# Patient Record
Sex: Male | Born: 1960 | Race: White | Hispanic: No | Marital: Single | Smoking: Former smoker
Health system: Southern US, Community
[De-identification: ages and names within clinical notes are randomized; demographics above are authoritative.]

## PROBLEM LIST (undated history)

## (undated) DIAGNOSIS — H409 Unspecified glaucoma: Secondary | ICD-10-CM

## (undated) DIAGNOSIS — N200 Calculus of kidney: Secondary | ICD-10-CM

## (undated) HISTORY — PX: TENDON REPAIR: SHX5111

## (undated) HISTORY — PX: TONSILLECTOMY: SUR1361

## (undated) HISTORY — PX: TONSILLECTOMY: SHX5217

## (undated) HISTORY — DX: Calculus of kidney: N20.0

## (undated) HISTORY — PX: NO PAST SURGERIES: SHX2092

## (undated) HISTORY — PX: HAND TENDON SURGERY: SHX663

---

## 2012-09-09 ENCOUNTER — Emergency Department: Payer: Self-pay | Admitting: Emergency Medicine

## 2012-10-12 ENCOUNTER — Emergency Department: Payer: Self-pay | Admitting: Emergency Medicine

## 2014-07-01 ENCOUNTER — Ambulatory Visit: Payer: Self-pay | Admitting: Physician Assistant

## 2014-09-18 ENCOUNTER — Ambulatory Visit
Admission: EM | Admit: 2014-09-18 | Discharge: 2014-09-18 | Disposition: A | Discharge status: Home / Self Care | Payer: Self-pay | Attending: Internal Medicine | Admitting: Internal Medicine

## 2014-09-18 ENCOUNTER — Encounter: Payer: Self-pay | Admitting: Emergency Medicine

## 2014-09-18 DIAGNOSIS — M779 Enthesopathy, unspecified: Secondary | ICD-10-CM | POA: Insufficient documentation

## 2014-09-18 DIAGNOSIS — R310 Gross hematuria: Secondary | ICD-10-CM | POA: Insufficient documentation

## 2014-09-18 DIAGNOSIS — M659 Synovitis and tenosynovitis, unspecified: Secondary | ICD-10-CM

## 2014-09-18 DIAGNOSIS — J069 Acute upper respiratory infection, unspecified: Secondary | ICD-10-CM | POA: Insufficient documentation

## 2014-09-18 DIAGNOSIS — Z87891 Personal history of nicotine dependence: Secondary | ICD-10-CM | POA: Insufficient documentation

## 2014-09-18 DIAGNOSIS — IMO0002 Reserved for concepts with insufficient information to code with codable children: Secondary | ICD-10-CM

## 2014-09-18 DIAGNOSIS — Z79899 Other long term (current) drug therapy: Secondary | ICD-10-CM | POA: Insufficient documentation

## 2014-09-18 HISTORY — DX: Unspecified glaucoma: H40.9

## 2014-09-18 LAB — URINALYSIS COMPLETE WITH MICROSCOPIC (ARMC ONLY)
Bacteria, UA: NONE SEEN — AB
Bilirubin Urine: NEGATIVE
GLUCOSE, UA: NEGATIVE mg/dL
KETONES UR: NEGATIVE mg/dL
LEUKOCYTES UA: NEGATIVE
Nitrite: NEGATIVE
PROTEIN: 100 mg/dL — AB
SPECIFIC GRAVITY, URINE: 1.015 (ref 1.005–1.030)
Squamous Epithelial / LPF: NONE SEEN — AB
pH: 5.5 (ref 5.0–8.0)

## 2014-09-18 MED ORDER — SULFAMETHOXAZOLE-TRIMETHOPRIM 800-160 MG PO TABS: 1.0000 | ORAL_TABLET | Freq: Two times a day (BID) | ORAL | Status: AC

## 2014-09-18 NOTE — ED Provider Notes (Signed)
CSN: [id number]     Arrival date & time 09/18/14  1525 History   First MD Initiated Contact with Patient 09/18/14 1647     Chief Complaint  Patient presents with  . Urine Output   HPI   Patient presents with hematuria, observed this morning. He has been taking Aleve 2 every morning for about a week, for right elbow tendinitis. He has no prior history of hematuria. He has no abdominal or pelvic pain. He denies dysuria, no urinary frequency. No change in stream. No change in bowel movements. No nausea or vomiting. Appetite is okay. No fever. For the last 7-10 days, he's had a little bit of productive cough, mildly runny/congested nose. No sore throat.  Past Medical History  Diagnosis Date  . Glaucoma    Past Surgical History  Procedure Laterality Date  . Hand tendon surgery Left     thumb  . Tonsillectomy     History reviewed. No pertinent family history. History  Substance Use Topics  . Smoking status: Former Games developermoker  . Smokeless tobacco: Not on file  . Alcohol Use: No    Review of Systems  All other systems reviewed and are negative.   Allergies  Review of patient's allergies indicates no known allergies.  Home Medications   Prior to Admission medications   Medication Sig Start Date End Date Taking? Authorizing Provider  latanoprost (XALATAN) 0.005 % ophthalmic solution 1 drop at bedtime.   Yes Historical Provider, MD  sulfamethoxazole-trimethoprim (BACTRIM DS,SEPTRA DS) 800-160 MG per tablet Take 1 tablet by mouth 2 (two) times daily. 09/18/14 09/28/14  Eustace MooreLaura W Monic Engelmann, MD   BP 130/80 mmHg  Pulse 66  Temp(Src) 97.6 F (36.4 C) (Oral)  Resp 16  Ht 5' 10.5" (1.791 m)  Wt 190 lb (86.183 kg)  BMI 26.87 kg/m2  SpO2 97% Physical Exam  Constitutional: He is oriented to person, place, and time. No distress.  Alert, nicely groomed  HENT:  Head: Atraumatic.  Bilateral TMs mildly dull, no erythema Mild to moderate nasal congestion bilaterally Throat benign  Eyes:   Conjugate gaze, no eye redness/drainage  Neck: Neck supple.  Cardiovascular: Normal rate and regular rhythm.   Pulmonary/Chest: No respiratory distress. He has no wheezes. He has no rales.  Lungs clear, symmetric breath sounds  Abdominal: Soft. He exhibits no distension. There is no tenderness. There is no rebound and no guarding.  Musculoskeletal: Normal range of motion.  Neurological: He is alert and oriented to person, place, and time.  Skin: Skin is warm and dry.  No cyanosis  Nursing note and vitals reviewed.   ED Course  Procedures (including critical care time) Labs Review Labs Reviewed  URINALYSIS COMPLETEWITH MICROSCOPIC ([facility] ONLY) - Abnormal; Notable for the following:    Color, Urine RED (*)    APPearance TURBID (*)    Hgb urine dipstick 3+ (*)    Protein, ur 100 (*)    Bacteria, UA NONE SEEN (*)    Squamous Epithelial / LPF NONE SEEN (*)    All other components within normal limits  URINE CULTURE    Imaging Review No results found.   MDM   1. Hematuria, gross   2. Tendinitis of elbow or forearm   3. Upper respiratory infection    Urine culture pending; prescription for Bactrim, for presumptive UTI/prostatitis. Patient to follow-up with either [facility], or [facility](Dr. plonk) in a week or so, to discuss further evaluation of hematuria. Continue Aleve 2 every morning over-the-counter  for the next week or so, and try to identify and limit lifting activities with the right arm, to let the right elbow flexors heal.    Eustace Moore, MD 09/18/14 1729

## 2014-09-18 NOTE — Discharge Instructions (Signed)
Prescription for sulfamethoxazole/trimethoprim (antibiotic) sent to the pharmacy.  Followup with Dr Hollace HaywardPlonk [facility] discuss further evaluation of blood in urine.    Hematuria Hematuria is blood in your urine. It can be caused by a bladder infection, kidney infection, prostate infection, kidney stone, or cancer of your urinary tract. Infections can usually be treated with medicine, and a kidney stone usually will pass through your urine. If neither of these is the cause of your hematuria, further workup to find out the reason may be needed. It is very important that you tell your health care provider about any blood you see in your urine, even if the blood stops without treatment or happens without causing pain. Blood in your urine that happens and then stops and then happens again can be a symptom of a very serious condition. Also, pain is not a symptom in the initial stages of many urinary cancers. HOME CARE INSTRUCTIONS   Drink lots of fluid, 3-4 quarts a day. If you have been diagnosed with an infection, cranberry juice is especially recommended, in addition to large amounts of water.  Avoid caffeine, tea, and carbonated beverages because they tend to irritate the bladder.  Avoid alcohol because it may irritate the prostate.  Take all medicines as directed by your health care provider.  If you were prescribed an antibiotic medicine, finish it all even if you start to feel better.  If you have been diagnosed with a kidney stone, follow your health care provider's instructions regarding straining your urine to catch the stone.  Empty your bladder often. Avoid holding urine for long periods of time.  After a bowel movement, women should cleanse front to back. Use each tissue only once.  Empty your bladder before and after sexual intercourse if you are a male. SEEK MEDICAL CARE IF:  You develop back pain.  You have a fever.  You have a feeling of sickness in your  stomach (nausea) or vomiting.  Your symptoms are not better in 3 days. Return sooner if you are getting worse. SEEK IMMEDIATE MEDICAL CARE IF:   You develop severe vomiting and are unable to keep the medicine down.  You develop severe back or abdominal pain despite taking your medicines.  You begin passing a large amount of blood or clots in your urine.  You feel extremely weak or faint, or you pass out. MAKE SURE YOU:   Understand these instructions.  Will watch your condition.  Will get help right away if you are not doing well or get worse. Document Released: 04/04/2005 Document Revised: 08/19/2013 Document Reviewed: 12/03/2012 [facility] Patient Information 2015 [place] [place]. This information is not intended to replace advice given to you by your health care provider. Make sure you discuss any questions you have with your health care provider.

## 2014-09-18 NOTE — ED Notes (Signed)
Pt reports dark urine, started about 1030 this morning. Denies abdominal pain.

## 2014-09-18 NOTE — ED Notes (Signed)
Pt also reports having a cough for a few days, and for a few weeks R arm pain especially when he strains.

## 2014-09-20 LAB — URINE CULTURE: CULTURE: NO GROWTH

## 2014-09-25 ENCOUNTER — Encounter: Payer: Self-pay | Admitting: Family Medicine

## 2014-09-25 ENCOUNTER — Ambulatory Visit (INDEPENDENT_AMBULATORY_CARE_PROVIDER_SITE_OTHER): Payer: 59 | Admitting: Family Medicine

## 2014-09-25 VITALS — BP 110/70 | HR 81 | Ht 70.5 in | Wt 194.4 lb

## 2014-09-25 DIAGNOSIS — Z801 Family history of malignant neoplasm of trachea, bronchus and lung: Secondary | ICD-10-CM | POA: Insufficient documentation

## 2014-09-25 DIAGNOSIS — Z800 Family history of malignant neoplasm of digestive organs: Secondary | ICD-10-CM

## 2014-09-25 DIAGNOSIS — Z8249 Family history of ischemic heart disease and other diseases of the circulatory system: Secondary | ICD-10-CM

## 2014-09-25 DIAGNOSIS — R319 Hematuria, unspecified: Secondary | ICD-10-CM | POA: Diagnosis not present

## 2014-09-25 DIAGNOSIS — Z23 Encounter for immunization: Secondary | ICD-10-CM | POA: Diagnosis not present

## 2014-09-25 DIAGNOSIS — Z720 Tobacco use: Secondary | ICD-10-CM | POA: Diagnosis not present

## 2014-09-25 DIAGNOSIS — H409 Unspecified glaucoma: Secondary | ICD-10-CM

## 2014-09-25 LAB — POCT URINALYSIS DIPSTICK
Bilirubin, UA: NEGATIVE
Glucose, UA: NEGATIVE
KETONES UA: NEGATIVE
LEUKOCYTES UA: NEGATIVE
NITRITE UA: NEGATIVE
Protein, UA: NEGATIVE
SPEC GRAV UA: 1.015
UROBILINOGEN UA: 0.2
pH, UA: 6

## 2014-09-25 NOTE — Progress Notes (Signed)
Date:  09/25/2014   Name:  Steven Vega   DOB:  1960-05-16   MRN:  [id number]  PCP:  Adline Potter, MD    Chief Complaint: Establish Care and Hematuria   History of Present Illness:  This is a 54 y.o. male seen at [facility] last week for gross hematuria, placed on Bactrim and urine cx sent (negative). Gross hematuria resolved, no pain, no recent trauma, no prior hx hematuria. Does have glaucoma for which he sees optho and is on drops. Last tetanus > 10 yrs ago.  Review of Systems:  Review of Systems  Constitutional: Negative for fever, appetite change and fatigue.  Respiratory: Negative for shortness of breath.   Cardiovascular: Negative for chest pain and leg swelling.  Gastrointestinal: Negative for abdominal pain.  Genitourinary: Negative for dysuria, flank pain, difficulty urinating, penile pain and testicular pain.  Skin: Negative for rash.  Hematological: Negative for adenopathy.    Patient Active Problem List   Diagnosis Date Noted  . Hematuria, undiagnosed cause 09/25/2014  . Glaucoma 09/25/2014  . FH: heart disease 09/25/2014  . FH: colon cancer 09/25/2014  . FH: lung cancer 09/25/2014    Prior to Admission medications   Medication Sig Start Date End Date Taking? Authorizing Provider  latanoprost (XALATAN) 0.005 % ophthalmic solution 1 drop at bedtime.   Yes Historical Provider, MD  sulfamethoxazole-trimethoprim (BACTRIM DS,SEPTRA DS) 800-160 MG per tablet Take 1 tablet by mouth 2 (two) times daily. 09/18/14 09/28/14 Yes Sherlene Shams, MD    No Known Allergies  Past Surgical History  Procedure Laterality Date  . Hand tendon surgery Left     thumb  . Tonsillectomy    . Tonsillectomy Bilateral     3rd grade  . Tendon repair Left     54yr old    History  Substance Use Topics  . Smoking status: Former Smoker -- 1.00 packs/day for 20 years    Types: Cigarettes    Quit date: 09/24/2009  . Smokeless tobacco: Current User    Types: Chew  . Alcohol Use: Not  on file    Family History  Problem Relation Age of Onset  . Cancer Mother   . Cancer Father     Medication list has been reviewed and updated.  Physical Examination: BP 110/70 mmHg  Pulse 81  Ht 5' 10.5" (1.791 m)  Wt 194 lb 6.4 oz (88.179 kg)  BMI 27.49 kg/m2  Physical Exam  Constitutional: He is oriented to person, place, and time. He appears well-developed and well-nourished. No distress.  HENT:  Head: Normocephalic and atraumatic.  Mouth/Throat: Oropharynx is clear and moist.  Eyes: EOM are normal. Pupils are equal, round, and reactive to light. No scleral icterus.  Neck: Neck supple. No thyromegaly present.  Cardiovascular: Normal rate, regular rhythm and normal heart sounds.   Pulmonary/Chest: Effort normal and breath sounds normal.  Abdominal: Soft. He exhibits no distension and no mass. There is no tenderness.  Genitourinary: Penis normal. No penile tenderness.  Musculoskeletal: He exhibits no edema.  Neurological: He is alert and oriented to person, place, and time. Coordination normal.  Skin: Skin is warm and dry. He is not diaphoretic.  Psychiatric: He has a normal mood and affect. His behavior is normal.    Assessment and Plan:  1. Hematuria, undiagnosed cause Urine cx negative, stop antibiotic, refer urology - Comp Met (CMET) - CBC - Ambulatory referral to Urology - POCT urinalysis dipstick  2. Glaucoma Continue eye drops per optho  3. Tobacco chew use Recommend d/c  4. FH: heart disease - Lipid Profile  5. FH: colon cancer Needs colonoscopy  6. FH: lung cancer   Return in about 1 month (around 10/25/2014).  Satira Anis. [facility]  09/25/2014

## 2014-09-25 NOTE — Patient Instructions (Signed)
Stop antibiotic, avoid aspirin and Aleve.

## 2014-09-26 LAB — COMPREHENSIVE METABOLIC PANEL
ALT: 29 IU/L (ref 0–44)
AST: 23 IU/L (ref 0–40)
Albumin/Globulin Ratio: 1.9 (ref 1.1–2.5)
Albumin: 4.5 g/dL (ref 3.5–5.5)
Alkaline Phosphatase: 99 IU/L (ref 39–117)
BILIRUBIN TOTAL: 0.3 mg/dL (ref 0.0–1.2)
BUN/Creatinine Ratio: 12 (ref 9–20)
BUN: 13 mg/dL (ref 6–24)
CHLORIDE: 102 mmol/L (ref 97–108)
CO2: 20 mmol/L (ref 18–29)
Calcium: 9.3 mg/dL (ref 8.7–10.2)
Creatinine, Ser: 1.09 mg/dL (ref 0.76–1.27)
GFR calc non Af Amer: 77 mL/min/{1.73_m2} (ref >59)
GFR, EST AFRICAN AMERICAN: 88 mL/min/{1.73_m2} (ref >59)
Globulin, Total: 2.4 g/dL (ref 1.5–4.5)
Glucose: 77 mg/dL (ref 65–99)
Potassium: 4.8 mmol/L (ref 3.5–5.2)
Sodium: 140 mmol/L (ref 134–144)
Total Protein: 6.9 g/dL (ref 6.0–8.5)

## 2014-09-26 LAB — CBC
Hematocrit: 45.9 % (ref 37.5–51.0)
Hemoglobin: 15.7 g/dL (ref 12.6–17.7)
MCH: 31 pg (ref 26.6–33.0)
MCHC: 34.2 g/dL (ref 31.5–35.7)
MCV: 91 fL (ref 79–97)
Platelets: 273 10*3/uL (ref 150–379)
RBC: 5.07 x10E6/uL (ref 4.14–5.80)
RDW: 13.1 % (ref 12.3–15.4)
WBC: 9.4 10*3/uL (ref 3.4–10.8)

## 2014-09-26 LAB — LIPID PANEL
CHOL/HDL RATIO: 4.3 ratio (ref 0.0–5.0)
CHOLESTEROL TOTAL: 178 mg/dL (ref 100–199)
HDL: 41 mg/dL (ref >39)
LDL Calculated: 101 mg/dL — ABNORMAL HIGH (ref 0–99)
TRIGLYCERIDES: 181 mg/dL — AB (ref 0–149)
VLDL Cholesterol Cal: 36 mg/dL (ref 5–40)

## 2014-10-03 ENCOUNTER — Ambulatory Visit
Admission: EM | Admit: 2014-10-03 | Discharge: 2014-10-03 | Disposition: A | Discharge status: Other Acute Inpatient Hospital | Payer: Worker's Compensation | Attending: Family Medicine | Admitting: Family Medicine

## 2014-10-03 ENCOUNTER — Encounter: Payer: Self-pay | Admitting: Emergency Medicine

## 2014-10-03 DIAGNOSIS — S68129A Partial traumatic metacarpophalangeal amputation of unspecified finger, initial encounter: Secondary | ICD-10-CM

## 2014-10-03 DIAGNOSIS — S68119A Complete traumatic metacarpophalangeal amputation of unspecified finger, initial encounter: Secondary | ICD-10-CM

## 2014-10-03 MED ORDER — HYDROMORPHONE HCL 1 MG/ML IJ SOLN
1.0000 mg | Freq: Once | INTRAMUSCULAR | Status: AC
  Administered 2014-10-03: 1 mg via INTRAMUSCULAR

## 2014-10-03 NOTE — ED Provider Notes (Signed)
CSN: [id number]     Arrival date & time 10/03/14  1237 History   First MD Initiated Contact with Patient 10/03/14 1301     Chief Complaint  Patient presents with  . Laceration   (Consider location/radiation/quality/duration/timing/severity/associated sxs/prior Treatment) HPI Comments: 54 yo male presents with a right 3rd fingertip injury from a slide valve on his pump. Patient states it cut off the tip of his finger.    Past Medical History  Diagnosis Date  . Glaucoma    Past Surgical History  Procedure Laterality Date  . Hand tendon surgery Left     thumb  . Tonsillectomy    . Tonsillectomy Bilateral     3rd grade  . Tendon repair Left     54yrs old   Family History  Problem Relation Age of Onset  . Cancer Mother   . Cancer Father    History  Substance Use Topics  . Smoking status: Former Smoker -- 1.00 packs/day for 20 years    Types: Cigarettes    Quit date: 09/24/2009  . Smokeless tobacco: Current User    Types: Chew  . Alcohol Use: Not on file    Review of Systems  Allergies  Review of patient's allergies indicates no known allergies.  Home Medications   Prior to Admission medications   Medication Sig Start Date End Date Taking? Authorizing Provider  latanoprost (XALATAN) 0.005 % ophthalmic solution 1 drop at bedtime.    Historical Provider, MD   BP 124/80 mmHg  Pulse 85  Temp(Src) 98 F (36.7 C) (Oral)  Resp 17  SpO2 97% Physical Exam  Constitutional: He appears well-developed and well-nourished. No distress.  Musculoskeletal:       Hands: Soft tissue tip of right 3 finger missing with exposure of distal phalanx bone  Skin: He is not diaphoretic.  Nursing note and vitals reviewed.   ED Course  Procedures (including critical care time) Labs Review Labs Reviewed - No data to display  Imaging Review No results found.   MDM   1. Traumatic amputation of fingertip, initial encounter    Plan: 1. diagnosis reviewed with patient and sister;  tetanus up to date per patient 2. Dilaudid 1mg  IM x1 given in clinic and tolerated well; wound bandaged 3. Recommend patient go to ED for further evaluation/management and likely hand orthopedic surgeon evaluation/management 4. Patient being transported by private vehicle with sister driving  Payton Mccallum, MD 10/03/14 [phone]

## 2014-10-03 NOTE — ED Notes (Signed)
Patient states that a valve on his pump and the lid on the valve snapped his right 3rd finger today.

## 2014-10-10 ENCOUNTER — Ambulatory Visit
Admission: EM | Admit: 2014-10-10 | Discharge: 2014-10-10 | Disposition: A | Discharge status: Home / Self Care | Payer: Worker's Compensation | Attending: Family Medicine | Admitting: Family Medicine

## 2014-10-10 ENCOUNTER — Encounter: Payer: Self-pay | Admitting: Emergency Medicine

## 2014-10-10 DIAGNOSIS — S68129A Partial traumatic metacarpophalangeal amputation of unspecified finger, initial encounter: Secondary | ICD-10-CM

## 2014-10-10 DIAGNOSIS — S68119D Complete traumatic metacarpophalangeal amputation of unspecified finger, subsequent encounter: Secondary | ICD-10-CM

## 2014-10-10 MED ORDER — OXYCODONE-ACETAMINOPHEN 5-325 MG PO TABS: 1.0000 | ORAL_TABLET | Freq: Three times a day (TID) | ORAL | Status: DC | PRN

## 2014-10-10 MED ORDER — KETOROLAC TROMETHAMINE 60 MG/2ML IM SOLN
60.0000 mg | Freq: Once | INTRAMUSCULAR | Status: AC
  Administered 2014-10-10: 60 mg via INTRAMUSCULAR

## 2014-10-10 NOTE — ED Notes (Signed)
Injured finger on right hand 1 week ago.at work.  Dressing has not been changed. Wound is weeping and need be checked. Appointment with hand surgeon is not until October 15, 2014

## 2014-10-10 NOTE — ED Provider Notes (Signed)
CSN: [id number]     Arrival date & time 10/10/14  1302 History   First MD Initiated Contact with Patient 10/10/14 1352     Chief Complaint  Patient presents with  . Finger Injury   (Consider location/radiation/quality/duration/timing/severity/associated sxs/prior Treatment) HPI Comments: 54 yo male with a fingertip amputation injury 1 week ago. Seen at [facility] ED, fingertip wound dressed, but has not been changed. Patient has not been seen for follow up, but states has an appointment at [facility] orthopedics next week Tuesday. Taking keflex and percocet. Denies any fevers or chills.   The history is provided by the patient.    Past Medical History  Diagnosis Date  . Glaucoma    Past Surgical History  Procedure Laterality Date  . Hand tendon surgery Left     thumb  . Tonsillectomy    . Tonsillectomy Bilateral     3rd grade  . Tendon repair Left     54yrs old  . No past surgeries     Family History  Problem Relation Age of Onset  . Cancer Mother   . Cancer Father    History  Substance Use Topics  . Smoking status: Former Smoker -- 1.00 packs/day for 20 years    Types: Cigarettes    Quit date: 09/24/2009  . Smokeless tobacco: Current User    Types: Chew  . Alcohol Use: No    Review of Systems  Allergies  Review of patient's allergies indicates no known allergies.  Home Medications   Prior to Admission medications   Medication Sig Start Date End Date Taking? Authorizing Provider  cephALEXin (KEFLEX) 500 MG capsule Take 500 mg by mouth 4 (four) times daily.   Yes Historical Provider, MD  latanoprost (XALATAN) 0.005 % ophthalmic solution 1 drop at bedtime.   Yes Historical Provider, MD  oxyCODONE-acetaminophen (PERCOCET) 5-325 MG per tablet Take 1-2 tablets by mouth every 8 (eight) hours as needed for severe pain. 10/10/14   Payton Mccallum, MD   BP 129/76 mmHg  Pulse 68  Temp(Src) 97.4 F (36.3 C) (Oral)  Resp 18  Ht 5\' 10"  (1.778 m)  Wt 194 lb (87.998 kg)  BMI 27.84  kg/m2  SpO2 96% Physical Exam  Constitutional: He appears well-developed and well-nourished.  Musculoskeletal:  Right 3rd fingertip wound healing  Nursing note and vitals reviewed.   ED Course  Procedures (including critical care time) Labs Review Labs Reviewed - No data to display  Imaging Review No results found.   MDM   1. Traumatic amputation of fingertip, subsequent encounter    Discharge Medication List as of 10/10/2014  3:54 PM      1.Patient given Toradol 60mg  IM x 1 for pain. Dressing changed. Patient tolerated well. Follow up with orthopedics as scheduled.  Plan: 2. rx as per orders; risks, benefits, potential side effects reviewed with patient (patient given printed rx for Percocet)    Payton Mccallum, MD 10/10/14 [phone]

## 2014-10-13 ENCOUNTER — Ambulatory Visit (INDEPENDENT_AMBULATORY_CARE_PROVIDER_SITE_OTHER): Payer: 59 | Admitting: Urology

## 2014-10-13 ENCOUNTER — Encounter: Payer: Self-pay | Admitting: Urology

## 2014-10-13 VITALS — BP 145/90 | HR 90 | Ht 70.0 in | Wt 188.0 lb

## 2014-10-13 DIAGNOSIS — R319 Hematuria, unspecified: Secondary | ICD-10-CM

## 2014-10-13 DIAGNOSIS — Z8042 Family history of malignant neoplasm of prostate: Secondary | ICD-10-CM

## 2014-10-13 LAB — URINALYSIS, COMPLETE
BILIRUBIN UA: NEGATIVE
Glucose, UA: NEGATIVE
Leukocytes, UA: NEGATIVE
Nitrite, UA: NEGATIVE
Specific Gravity, UA: >1.03 — ABNORMAL HIGH (ref 1.005–1.030)
Urobilinogen, Ur: 0.2 mg/dL (ref 0.2–1.0)
pH, UA: 5.5 (ref 5.0–7.5)

## 2014-10-13 LAB — MICROSCOPIC EXAMINATION

## 2014-10-13 NOTE — Progress Notes (Signed)
10/13/2014 3:20 PM   Alfonse AlpersCharles G Pixley 03-15-1961 [id number]  Referring provider: Schuyler AmorWilliam Plonk, MD [address] [place], [place] [address]  Chief Complaint  Patient presents with  . Hematuria    Gross Hematuria. pt saw blood in his urine x 3 wks ago.     HPI:  Mr. Steven DestineHuffstetler is a 54 year old white male with a history of painless gross hematuria 3 weeks ago. He states nothing precipitated the event. He just went to urinate and he urinated a very dark brown urine. His urine is starting to clear over the last several days, but it remains an amber color.   He did have 3-10 RBC's per power field on his urinalysis with us today. Patient is very nervous about this finding. His hands are actually shaking.  He is fearful he may have cancer.  His baseline urinary symptoms are and frequent nocturia, and frequent intermittency and infrequent weak stream. Does not have a history of urinary tract infections, kidney stones or GU malignancies. Father does have a history of prostate cancer diagnosed in his late 6460's.  He has not had a prostate exam he does not have a recent PSA.  He denies any fevers, chills, nausea, vomiting, flank pain, suprapubic pain or dysuria. The dark urine has abated.   PMH: Past Medical History  Diagnosis Date  . Glaucoma     Surgical History: Past Surgical History  Procedure Laterality Date  . Hand tendon surgery Left     thumb  . Tonsillectomy    . Tonsillectomy Bilateral     3rd grade  . Tendon repair Left     [age]yrs old  . No past surgeries      Home Medications:    Medication List       This list is accurate as of: 10/13/14  3:20 PM.  Always use your most recent med list.               cephALEXin 500 MG capsule  Commonly known as:  KEFLEX  Take 500 mg by mouth 4 (four) times daily.     latanoprost 0.005 % ophthalmic solution  Commonly known as:  XALATAN  1 drop at bedtime.     oxyCODONE-acetaminophen 5-325 MG per tablet    Commonly known as:  PERCOCET  Take 1-2 tablets by mouth every 8 (eight) hours as needed for severe pain.        Allergies: No Known Allergies  Family History: Family History  Problem Relation Age of Onset  . Breast cancer Mother   . Prostate cancer Father   . Lung cancer Mother     Social History:  reports that he quit smoking about 5 years ago. His smoking use included Cigarettes. He has a 20 pack-year smoking history. His smokeless tobacco use includes Chew. He reports that he does not drink alcohol or use illicit drugs.  ROS: Urological Symptom Review  Patient is experiencing the following symptoms: Get up at night to urinate Leakage of urine Blood in urine Weak stream   Review of Systems  Gastrointestinal (upper)  : Negative for upper GI symptoms  Gastrointestinal (lower) : Negative for lower GI symptoms  Constitutional : Negative for symptoms  Skin: Negative for skin symptoms  Eyes: Negative for eye symptoms  Ear/Nose/Throat : Negative for Ear/Nose/Throat symptoms  Hematologic/Lymphatic: Negative for Hematologic/Lymphatic symptoms  Cardiovascular : Negative for cardiovascular symptoms  Respiratory : Negative for respiratory symptoms  Endocrine: Negative for endocrine symptoms  Musculoskeletal: Negative  for musculoskeletal symptoms  Neurological: Negative for neurological symptoms  Psychologic: Negative for psychiatric symptoms   Physical Exam: BP 145/90 mmHg  Pulse 90  Ht  (1.778 m)  Wt 188 lb (85.276 kg)  BMI 26.98 kg/m2  Constitutional:  Alert and oriented, No acute distress. HEENT: [place] AT, moist mucus membranes.  Trachea midline, no masses. Cardiovascular: No clubbing, cyanosis, or edema. Respiratory: Normal respiratory effort, no increased work of breathing. GI: Abdomen is soft, nontender, nondistended, no abdominal masses GU: No CVA tenderness. GU: Patient with circumcised phallus.  Urethral meatus is patent.  No penile  discharge. No penile lesions or rashes. Scrotum without lesions, cysts, rashes and/or edema.  Testicles are located scrotally bilaterally. No masses are appreciated in the testicles. Left and right epididymis are normal.  Rectal: Patient with  normal sphincter tone. Perineum without scarring or rashes. No rectal masses are appreciated. Prostate is approximately 45 grams, no nodules are appreciated. Seminal vesicles are normal.  Skin: No rashes, bruises or suspicious lesions. Lymph: No cervical or inguinal adenopathy. Neurologic: Grossly intact, no focal deficits, moving all 4 extremities. Psychiatric: Normal mood and affect.  Laboratory Data: Results for orders placed or performed in visit on 10/13/14  Microscopic Examination  Result Value Ref Range   WBC, UA 0-5 0 -  5 /hpf   RBC, UA 11-30 (A) 0 -  2 /hpf   Epithelial Cells (non renal) 0-10 0 - 10 /hpf   Mucus, UA Present (A) Not Estab.   Bacteria, UA Moderate (A) None seen/Few  Urinalysis, Complete  Result Value Ref Range   Specific Gravity, UA >1.030 (H) 1.005 - 1.030   pH, UA 5.5 5.0 - 7.5   Color, UA Amber (A) Yellow   Appearance Ur Clear Clear   Leukocytes, UA Negative Negative   Protein, UA 2+ (A) Negative/Trace   Glucose, UA Negative Negative   Ketones, UA Trace (A) Negative   RBC, UA 3+ (A) Negative   Bilirubin, UA Negative Negative   Urobilinogen, Ur 0.2 0.2 - 1.0 mg/dL   Nitrite, UA Negative Negative   Microscopic Examination See below:    Lab Results  Component Value Date   WBC 9.4 09/25/2014   HCT 45.9 09/25/2014    Lab Results  Component Value Date   CREATININE 1.09 09/25/2014    No results found for: PSA  No results found for: TESTOSTERONE  No results found for: HGBA1C  Urinalysis    Component Value Date/Time   COLORURINE RED* 09/18/2014 1630   APPEARANCEUR TURBID* 09/18/2014 1630   LABSPEC 1.015 09/18/2014 1630   PHURINE 5.5 09/18/2014 1630   GLUCOSEU NEGATIVE 09/18/2014 1630   HGBUR 3+*  09/18/2014 1630   BILIRUBINUR negative 09/25/2014 1604   BILIRUBINUR NEGATIVE 09/18/2014 1630   KETONESUR NEGATIVE 09/18/2014 1630   PROTEINUR negative 09/25/2014 1604   PROTEINUR 100* 09/18/2014 1630   UROBILINOGEN 0.2 09/25/2014 1604   NITRITE negative 09/25/2014 1604   NITRITE NEGATIVE 09/18/2014 1630   LEUKOCYTESUR Negative 09/25/2014 1604    Pertinent Imaging:   Assessment & Plan:    1. Gross hematuria:   Explained to patient the causes of blood in the urine are as follows: stones, BPH, UTI's, damage to the urinary tract and/or cancer.  It is explained to the patient that they will be scheduled for a CT Urogram with contrast material and that in rare instances, an allergic reaction can be serious and even life threatening with the injection of contrast material.   The patient  denies any allergies to contrast, iodine and/or seafood and is not taking metformin.  - Urinalysis, Complete  2. Family history of prostate cancer:  Patient's father was diagnosed with prostate cancer at his late 78's. He does not have a prior PSA. We'll obtain a PSA when patient returns for his CT urogram report.   No Follow-up on file.  Michiel Cowboy, PA-C  [facility] [address], [address] [place], [place] [address] [phone]

## 2014-10-22 ENCOUNTER — Ambulatory Visit
Admission: RE | Admit: 2014-10-22 | Discharge: 2014-10-22 | Disposition: A | Discharge status: Home / Self Care | Payer: 59 | Source: Ambulatory Visit | Attending: Urology | Admitting: Urology

## 2014-10-22 DIAGNOSIS — R319 Hematuria, unspecified: Secondary | ICD-10-CM

## 2014-10-22 DIAGNOSIS — R932 Abnormal findings on diagnostic imaging of liver and biliary tract: Secondary | ICD-10-CM | POA: Insufficient documentation

## 2014-10-22 DIAGNOSIS — N200 Calculus of kidney: Secondary | ICD-10-CM | POA: Insufficient documentation

## 2014-10-22 DIAGNOSIS — R310 Gross hematuria: Secondary | ICD-10-CM | POA: Diagnosis present

## 2014-10-22 MED ORDER — IOHEXOL 350 MG/ML SOLN
125.0000 mL | Freq: Once | INTRAVENOUS | Status: AC | PRN
  Administered 2014-10-22: 150 mL via INTRAVENOUS

## 2014-11-13 ENCOUNTER — Encounter: Payer: Self-pay | Admitting: Urology

## 2014-11-13 ENCOUNTER — Ambulatory Visit
Admission: RE | Admit: 2014-11-13 | Discharge: 2014-11-13 | Disposition: A | Discharge status: Home / Self Care | Payer: 59 | Source: Ambulatory Visit | Attending: Urology | Admitting: Urology

## 2014-11-13 ENCOUNTER — Ambulatory Visit (INDEPENDENT_AMBULATORY_CARE_PROVIDER_SITE_OTHER): Payer: 59 | Admitting: Urology

## 2014-11-13 VITALS — BP 120/81 | HR 82 | Ht 70.2 in | Wt 191.6 lb

## 2014-11-13 DIAGNOSIS — N201 Calculus of ureter: Secondary | ICD-10-CM | POA: Diagnosis not present

## 2014-11-13 DIAGNOSIS — R310 Gross hematuria: Secondary | ICD-10-CM

## 2014-11-13 DIAGNOSIS — Z8042 Family history of malignant neoplasm of prostate: Secondary | ICD-10-CM | POA: Diagnosis not present

## 2014-11-13 LAB — URINALYSIS, COMPLETE
Bilirubin, UA: NEGATIVE
Glucose, UA: NEGATIVE
Ketones, UA: NEGATIVE
Leukocytes, UA: NEGATIVE
Nitrite, UA: NEGATIVE
PH UA: 5 (ref 5.0–7.5)
PROTEIN UA: NEGATIVE
Specific Gravity, UA: >1.03 — ABNORMAL HIGH (ref 1.005–1.030)
Urobilinogen, Ur: 0.2 mg/dL (ref 0.2–1.0)

## 2014-11-13 LAB — MICROSCOPIC EXAMINATION: WBC, UA: NONE SEEN /hpf (ref 0–?)

## 2014-11-13 NOTE — Progress Notes (Signed)
2:04 PM   Steven Vega 06/05/1960 [id number]  Referring provider: Schuyler Amor, MD [address] [place], [place] [address]  Chief Complaint  Patient presents with  . Results    CT    HPI: Steven Vega is a 54 year old white male who present today to discuss his CT Urogram results.  He is experiencing left side pain for the last few days.  He describes it as dull, colicky ache.  He is not experiencing any gross hematuria.  He is not having fevers, chills, nausea or vomiting.    Previous history:   Patient presented with a 3 week  history of painless gross hematuria one month ago. He states nothing precipitated the event. He just went to urinate and he urinated a very dark brown urine.   He did have 3-10 RBC's per power field on his urinalysis on 10/13/2014 and 11-30 RBC's/hpf with [place] today.   His baseline urinary symptoms are and frequent nocturia, and frequent intermittency and infrequent weak stream. Does not have a history of urinary tract infections, kidney stones or GU malignancies. Father does have a history of prostate cancer diagnosed in his late 8's.  He has not had a prostate exam he does not have a recent PSA.  I have reviewed the films with the patient.  He was found to have a 6 mm stone in the left UPJ.    PMH: Past Medical History  Diagnosis Date  . Glaucoma     Surgical History: Past Surgical History  Procedure Laterality Date  . Hand tendon surgery Left     thumb  . Tonsillectomy    . Tonsillectomy Bilateral     3rd grade  . Tendon repair Left     54yrs old  . No past surgeries      Home Medications:    Medication List       This list is accurate as of: 11/13/14  2:04 PM.  Always use your most recent med list.               cephALEXin 500 MG capsule  Commonly known as:  KEFLEX  Take 500 mg by mouth 4 (four) times daily.     latanoprost 0.005 % ophthalmic solution  Commonly known as:  XALATAN  1 drop at bedtime.     oxyCODONE-acetaminophen 5-325 MG per tablet  Commonly known as:  PERCOCET  Take 1-2 tablets by mouth every 8 (eight) hours as needed for severe pain.        Allergies: No Known Allergies  Family History: Family History  Problem Relation Age of Onset  . Breast cancer Mother   . Prostate cancer Father   . Lung cancer Mother     Social History:  reports that he quit smoking about 5 years ago. His smoking use included Cigarettes. He started smoking about 25 years ago. He has a 20 pack-year smoking history. His smokeless tobacco use includes Chew. He reports that he does not drink alcohol or use illicit drugs.  ROS: Urological Symptom Review  Patient is experiencing the following symptoms: Get up at night to urinate Leakage of urine Blood in urine Weak stream   Review of Systems  Gastrointestinal (upper)  : Negative for upper GI symptoms  Gastrointestinal (lower) : Negative for lower GI symptoms  Constitutional : Negative for symptoms  Skin: Negative for skin symptoms  Eyes: Negative for eye symptoms  Ear/Nose/Throat : Negative for Ear/Nose/Throat symptoms  Hematologic/Lymphatic:  Negative for Hematologic/Lymphatic symptoms  Cardiovascular : Negative for cardiovascular symptoms  Respiratory : Negative for respiratory symptoms  Endocrine: Negative for endocrine symptoms  Musculoskeletal: Negative for musculoskeletal symptoms  Neurological: Negative for neurological symptoms  Psychologic: Negative for psychiatric symptoms   Physical Exam: BP 120/81 mmHg  Pulse 82  Ht 5' 10.2" (1.783 m)  Wt 191 lb 9.6 oz (86.909 kg)  BMI 27.34 kg/m2  Constitutional:  Alert and oriented, No acute distress. HEENT: [place] AT, moist mucus membranes.  Trachea midline, no masses. Cardiovascular: No clubbing, cyanosis, or edema. Respiratory: Normal respiratory effort, no increased work of breathing. GI: Abdomen is soft, nontender, nondistended, no abdominal masses GU: No  CVA tenderness.  Skin: No rashes, bruises or suspicious lesions. Lymph: No cervical or inguinal adenopathy. Neurologic: Grossly intact, no focal deficits, moving all 4 extremities. Psychiatric: Normal mood and affect.  Laboratory Data: Results for orders placed or performed in visit on 11/13/14  Microscopic Examination  Result Value Ref Range   WBC, UA None seen 0 -  5 /hpf   RBC, UA 11-30 (A) 0 -  2 /hpf   Epithelial Cells (non renal) 0-10 0 - 10 /hpf   Bacteria, UA Few None seen/Few  PSA  Result Value Ref Range   Prostate Specific Ag, Serum 0.7 0.0 - 4.0 ng/mL  Urinalysis, Complete  Result Value Ref Range   Specific Gravity, UA >1.030 (H) 1.005 - 1.030   pH, UA 5.0 5.0 - 7.5   Color, UA Yellow Yellow   Appearance Ur Clear Clear   Leukocytes, UA Negative Negative   Protein, UA Negative Negative/Trace   Glucose, UA Negative Negative   Ketones, UA Negative Negative   RBC, UA 3+ (A) Negative   Bilirubin, UA Negative Negative   Urobilinogen, Ur 0.2 0.2 - 1.0 mg/dL   Nitrite, UA Negative Negative   Microscopic Examination See below:    Lab Results  Component Value Date   WBC 9.4 09/25/2014   HCT 45.9 09/25/2014    Lab Results  Component Value Date   CREATININE 1.09 09/25/2014    No results found for: PSA  No results found for: TESTOSTERONE  No results found for: HGBA1C  Urinalysis    Component Value Date/Time   COLORURINE RED* 09/18/2014 1630   APPEARANCEUR TURBID* 09/18/2014 1630   LABSPEC 1.015 09/18/2014 1630   PHURINE 5.5 09/18/2014 1630   GLUCOSEU Negative 10/13/2014 1440   HGBUR 3+* 09/18/2014 1630   BILIRUBINUR Negative 10/13/2014 1440   BILIRUBINUR negative 09/25/2014 1604   BILIRUBINUR NEGATIVE 09/18/2014 1630   KETONESUR NEGATIVE 09/18/2014 1630   PROTEINUR negative 09/25/2014 1604   PROTEINUR 100* 09/18/2014 1630   UROBILINOGEN 0.2 09/25/2014 1604   NITRITE Negative 10/13/2014 1440   NITRITE negative 09/25/2014 1604   NITRITE NEGATIVE  09/18/2014 1630   LEUKOCYTESUR Negative 10/13/2014 1440   LEUKOCYTESUR Negative 09/25/2014 1604    Pertinent Imaging: CLINICAL DATA: Initial encounter for 1 month history of hematuria.  EXAM: CT ABDOMEN AND PELVIS WITHOUT AND WITH CONTRAST  TECHNIQUE: Multidetector CT imaging of the abdomen and pelvis was performed following the standard protocol before and following the bolus administration of intravenous contrast.  CONTRAST: OMNIPAQUE IOHEXOL 350 MG/ML SOLN  COMPARISON: None.  FINDINGS: Lower chest: Unremarkable.  Hepatobiliary: 3 mm hypodensity in the dome of the liver is too small to characterize but likely represents a tiny cyst. Liver parenchyma otherwise unremarkable. Tiny 1-2 mm stone is seen in the neck of the gallbladder (image 22 series  7). No intrahepatic or extrahepatic biliary dilation.  Pancreas: No focal mass lesion. No dilatation of the main duct. No intraparenchymal cyst. No peripancreatic edema.  Spleen: No splenomegaly. No focal mass lesion.  Adrenals/Urinary Tract: No adrenal nodule or mass. No evidence for an enhancing lesion in either kidney. No hydronephrosis.  No stones in the right kidney or ureter. 6 x 3 x 4 mm stone is seen at the left UPJ without hydronephrosis. 2 x 2 x 4 mm stone is seen in the upper pole of the left kidney and a 1 mm stone is seen in the interpolar region of the left kidney. No left ureteral stones. No bladder stones.  Delayed imaging shows no abnormality of either intrarenal collecting system. No wall thickening in either renal pelvis. Both ureters are well opacified without focal hydroureter, filling defect, or wall thickening. No focal bladder wall abnormality with a small segment of the posterior bladder wall incompletely evaluated due to adjacent non-opacified urine.  Stomach/Bowel: Stomach is nondistended. No gastric wall thickening. No evidence of outlet obstruction. Duodenum is normally  positioned as is the ligament of Treitz. No small bowel wall thickening. No small bowel dilatation. Terminal ileum is normal. The appendix is normal. Diverticular changes are noted in the left colon without evidence of diverticulitis.  Vascular/Lymphatic: There is abdominal aortic atherosclerosis without aneurysm. Portal vein is patent as is the superior mesenteric vein. There is a large venous collateral posterior to the stomach which courses from the portal vein to the left renal vein.  No abdominal lymphadenopathy. No evidence for pelvic sidewall lymphadenopathy.  Reproductive: Prostate gland and seminal vesicles are unremarkable.  Other: No intraperitoneal free fluid.  Musculoskeletal: Bone windows reveal no worrisome lytic or sclerotic osseous lesions.  IMPRESSION: 1. 6 x 3 x 4 mm stone identified at the left UPJ without obstruction. 2. 2 additional nonobstructing stones are seen in the left kidney. 3. No definite morphologic features of cirrhosis although there is a large collateral vein coursing from the portal vein to the left renal vein. This does raise the question of portal venous hypertension. 4. Probable 1-2 mm gallstone.   Electronically Signed  By: Kennith Center M.D.  On: 10/22/2014 16:51  CLINICAL DATA: Left ureteral stone.  EXAM: ABDOMEN - 1 VIEW  COMPARISON: CT abdomen and pelvis 10/22/2014  FINDINGS: Focal calcifications lateral to the L2-3 disc space on the left likely represent previously noted renal calculus. No new stones are present. The bowel gas pattern is normal. The axial skeleton is unremarkable.  IMPRESSION: 1. Calcifications projected near the left renal hilum suggest the previously noted left UPJ stone has not moved. 2. No new calcifications.   Electronically Signed  By: Marin Roberts M.D.  On: 11/13/2014 15:27  Assessment & Plan:    1. Left UPJ stone:   Patient was found to a left UPJ stone on CT  Urogram.  We discussed ESWL and URS/LL/stent placement.  I explained  each procedure to the patient and the potential risks.  Patient wants to pursue ESWL.  I explained to the patient that the ESWL may not rid him of the stone.  He may need another procedure after the ESWL for definitive treatment.  He may also need a stent after undergoing ESWL if the particles of the treated stone get lodged in the ureter.  He understands the risk of the ESWL and wishes to proceed with the ESWL.    2. Gross hematuria:   Patient will continued to be monitored to make  sure the hematuria does not persist after the stone has been treated.    - Urinalysis, Complete  3. Family history of prostate cancer:  Patient's father was diagnosed with prostate cancer at his late 44's. He does not have a prior PSA.  We will draw a PSA today.     No Follow-up on file.  Michiel Cowboy, PA-C  [facility] [address] [place], [place] [address] [phone]

## 2014-11-14 ENCOUNTER — Encounter: Payer: Self-pay | Admitting: Urology

## 2014-11-14 DIAGNOSIS — N201 Calculus of ureter: Secondary | ICD-10-CM | POA: Insufficient documentation

## 2014-11-14 LAB — PSA: Prostate Specific Ag, Serum: 0.7 ng/mL (ref 0.0–4.0)

## 2014-11-17 ENCOUNTER — Encounter
Admission: RE | Admit: 2014-11-17 | Discharge: 2014-11-17 | Disposition: A | Discharge status: Home / Self Care | Payer: 59 | Source: Ambulatory Visit | Attending: Urology | Admitting: Urology

## 2014-11-17 ENCOUNTER — Telehealth: Payer: Self-pay | Admitting: *Deleted

## 2014-11-17 DIAGNOSIS — Z79899 Other long term (current) drug therapy: Secondary | ICD-10-CM | POA: Diagnosis not present

## 2014-11-17 DIAGNOSIS — N200 Calculus of kidney: Secondary | ICD-10-CM | POA: Diagnosis present

## 2014-11-17 DIAGNOSIS — I10 Essential (primary) hypertension: Secondary | ICD-10-CM | POA: Diagnosis not present

## 2014-11-17 DIAGNOSIS — Z87891 Personal history of nicotine dependence: Secondary | ICD-10-CM | POA: Diagnosis not present

## 2014-11-17 DIAGNOSIS — N202 Calculus of kidney with calculus of ureter: Secondary | ICD-10-CM | POA: Diagnosis not present

## 2014-11-17 DIAGNOSIS — Z9889 Other specified postprocedural states: Secondary | ICD-10-CM | POA: Diagnosis not present

## 2014-11-17 HISTORY — PX: EXTRACORPOREAL SHOCK WAVE LITHOTRIPSY: SHX1557

## 2014-11-17 NOTE — Telephone Encounter (Signed)
Attempted to contact pt. w/ results.  No answer and no VM set-up. 1st attempt. . . sm

## 2014-11-17 NOTE — Telephone Encounter (Signed)
-----   Message from Harle Battiest, PA-C sent at 11/15/2014  5:47 PM EDT ----- PSA is good.  He should have a DRE and a PSA on a yearly basis.

## 2014-11-18 NOTE — Telephone Encounter (Signed)
I spoke w/the patient and relayed their lab results as well as recommendation to have annual exams.  The pt indicated understanding and had no questions. . . . sm

## 2014-11-20 ENCOUNTER — Ambulatory Visit
Admission: RE | Admit: 2014-11-20 | Discharge: 2014-11-20 | Disposition: A | Discharge status: Home / Self Care | Payer: 59 | Source: Ambulatory Visit | Attending: Urology | Admitting: Urology

## 2014-11-20 ENCOUNTER — Encounter
Admission: RE | Disposition: A | Discharge status: Home / Self Care | Payer: Self-pay | Source: Ambulatory Visit | Attending: Urology

## 2014-11-20 ENCOUNTER — Ambulatory Visit: Payer: 59

## 2014-11-20 ENCOUNTER — Encounter: Payer: Self-pay | Admitting: *Deleted

## 2014-11-20 DIAGNOSIS — Z87891 Personal history of nicotine dependence: Secondary | ICD-10-CM | POA: Insufficient documentation

## 2014-11-20 DIAGNOSIS — Z79899 Other long term (current) drug therapy: Secondary | ICD-10-CM | POA: Insufficient documentation

## 2014-11-20 DIAGNOSIS — I10 Essential (primary) hypertension: Secondary | ICD-10-CM | POA: Insufficient documentation

## 2014-11-20 DIAGNOSIS — N200 Calculus of kidney: Secondary | ICD-10-CM

## 2014-11-20 DIAGNOSIS — N202 Calculus of kidney with calculus of ureter: Secondary | ICD-10-CM | POA: Insufficient documentation

## 2014-11-20 DIAGNOSIS — Z9889 Other specified postprocedural states: Secondary | ICD-10-CM | POA: Insufficient documentation

## 2014-11-20 HISTORY — PX: EXTRACORPOREAL SHOCK WAVE LITHOTRIPSY: SHX1557

## 2014-11-20 SURGERY — LITHOTRIPSY, ESWL
Anesthesia: Choice | Laterality: Left

## 2014-11-20 MED ORDER — DIPHENHYDRAMINE HCL 25 MG PO CAPS
ORAL_CAPSULE | ORAL | Status: AC
  Administered 2014-11-20: 25 mg via ORAL
  Filled 2014-11-20: qty 1

## 2014-11-20 MED ORDER — CIPROFLOXACIN HCL 500 MG PO TABS
500.0000 mg | ORAL_TABLET | ORAL | Status: AC
  Administered 2014-11-20: 500 mg via ORAL

## 2014-11-20 MED ORDER — PROMETHAZINE HCL 25 MG/ML IJ SOLN
25.0000 mg | Freq: Once | INTRAMUSCULAR | Status: AC
  Administered 2014-11-20: 25 mg via INTRAMUSCULAR

## 2014-11-20 MED ORDER — DIPHENHYDRAMINE HCL 25 MG PO CAPS
25.0000 mg | ORAL_CAPSULE | ORAL | Status: AC
  Administered 2014-11-20: 25 mg via ORAL

## 2014-11-20 MED ORDER — TAMSULOSIN HCL 0.4 MG PO CAPS: 0.4000 mg | ORAL_CAPSULE | Freq: Every day | ORAL | Status: AC

## 2014-11-20 MED ORDER — DEXTROSE-NACL 5-0.45 % IV SOLN
INTRAVENOUS | Status: DC
  Administered 2014-11-20: 08:00:00 via INTRAVENOUS

## 2014-11-20 MED ORDER — PROMETHAZINE HCL 25 MG/ML IJ SOLN
INTRAMUSCULAR | Status: AC
  Administered 2014-11-20: 25 mg via INTRAMUSCULAR
  Filled 2014-11-20: qty 1

## 2014-11-20 MED ORDER — DIAZEPAM 5 MG PO TABS
10.0000 mg | ORAL_TABLET | ORAL | Status: AC
  Administered 2014-11-20: 10 mg via ORAL

## 2014-11-20 MED ORDER — OXYCODONE-ACETAMINOPHEN 5-325 MG PO TABS: 1.0000 | ORAL_TABLET | Freq: Three times a day (TID) | ORAL | Status: DC | PRN

## 2014-11-20 MED ORDER — DIAZEPAM 5 MG PO TABS
ORAL_TABLET | ORAL | Status: AC
  Administered 2014-11-20: 10 mg via ORAL
  Filled 2014-11-20: qty 2

## 2014-11-20 MED ORDER — CIPROFLOXACIN HCL 500 MG PO TABS
ORAL_TABLET | ORAL | Status: AC
  Administered 2014-11-20: 500 mg via ORAL
  Filled 2014-11-20: qty 1

## 2014-11-20 NOTE — Interval H&P Note (Signed)
History and Physical Interval Note:  11/20/2014 7:59 AM  Steven Vega  has presented today for surgery, with the diagnosis of LEFT UPJ STONE  The various methods of treatment have been discussed with the patient and family. After consideration of risks, benefits and other options for treatment, the patient has consented to  Procedure(s): EXTRACORPOREAL SHOCK WAVE LITHOTRIPSY (ESWL) (Left) as a surgical intervention .  The patient's history has been reviewed, patient examined, no change in status, stable for surgery.  I have reviewed the patient's chart and labs.  Questions were answered to the patient's satisfaction.     Vanna Scotland

## 2014-11-20 NOTE — H&P (View-Only) (Signed)
2:04 PM   Steven Vega 06/05/1960 [id number]  Referring provider: Schuyler Amor, MD [address] [place], [place] [address]  Chief Complaint  Patient presents with  . Results    CT    HPI: [name] is a 54 year old white male who present today to discuss his CT Urogram results.  He is experiencing left side pain for the last few days.  He describes it as dull, colicky ache.  He is not experiencing any gross hematuria.  He is not having fevers, chills, nausea or vomiting.    Previous history:   Patient presented with a 3 week  history of painless gross hematuria one month ago. He states nothing precipitated the event. He just went to urinate and he urinated a very dark brown urine.   He did have 3-10 RBC's per power field on his urinalysis on 10/13/2014 and 11-30 RBC's/hpf with [place] today.   His baseline urinary symptoms are and frequent nocturia, and frequent intermittency and infrequent weak stream. Does not have a history of urinary tract infections, kidney stones or GU malignancies. Father does have a history of prostate cancer diagnosed in his late 8's.  He has not had a prostate exam he does not have a recent PSA.  I have reviewed the films with the patient.  He was found to have a 6 mm stone in the left UPJ.    PMH: Past Medical History  Diagnosis Date  . Glaucoma     Surgical History: Past Surgical History  Procedure Laterality Date  . Hand tendon surgery Left     thumb  . Tonsillectomy    . Tonsillectomy Bilateral     3rd grade  . Tendon repair Left     54yrs old  . No past surgeries      Home Medications:    Medication List       This list is accurate as of: 11/13/14  2:04 PM.  Always use your most recent med list.               cephALEXin 500 MG capsule  Commonly known as:  KEFLEX  Take 500 mg by mouth 4 (four) times daily.     latanoprost 0.005 % ophthalmic solution  Commonly known as:  XALATAN  1 drop at bedtime.     oxyCODONE-acetaminophen 5-325 MG per tablet  Commonly known as:  PERCOCET  Take 1-2 tablets by mouth every 8 (eight) hours as needed for severe pain.        Allergies: No Known Allergies  Family History: Family History  Problem Relation Age of Onset  . Breast cancer Mother   . Prostate cancer Father   . Lung cancer Mother     Social History:  reports that he quit smoking about 5 years ago. His smoking use included Cigarettes. He started smoking about 25 years ago. He has a 20 pack-year smoking history. His smokeless tobacco use includes Chew. He reports that he does not drink alcohol or use illicit drugs.  ROS: Urological Symptom Review  Patient is experiencing the following symptoms: Get up at night to urinate Leakage of urine Blood in urine Weak stream   Review of Systems  Gastrointestinal (upper)  : Negative for upper GI symptoms  Gastrointestinal (lower) : Negative for lower GI symptoms  Constitutional : Negative for symptoms  Skin: Negative for skin symptoms  Eyes: Negative for eye symptoms  Ear/Nose/Throat : Negative for Ear/Nose/Throat symptoms  Hematologic/Lymphatic:  Negative for Hematologic/Lymphatic symptoms  Cardiovascular : Negative for cardiovascular symptoms  Respiratory : Negative for respiratory symptoms  Endocrine: Negative for endocrine symptoms  Musculoskeletal: Negative for musculoskeletal symptoms  Neurological: Negative for neurological symptoms  Psychologic: Negative for psychiatric symptoms   Physical Exam: BP 120/81 mmHg  Pulse 82  Ht 5' 10.2" (1.783 m)  Wt 191 lb 9.6 oz (86.909 kg)  BMI 27.34 kg/m2  Constitutional:  Alert and oriented, No acute distress. HEENT: [place] AT, moist mucus membranes.  Trachea midline, no masses. Cardiovascular: No clubbing, cyanosis, or edema. Respiratory: Normal respiratory effort, no increased work of breathing. GI: Abdomen is soft, nontender, nondistended, no abdominal masses GU: No  CVA tenderness.  Skin: No rashes, bruises or suspicious lesions. Lymph: No cervical or inguinal adenopathy. Neurologic: Grossly intact, no focal deficits, moving all 4 extremities. Psychiatric: Normal mood and affect.  Laboratory Data: Results for orders placed or performed in visit on 11/13/14  Microscopic Examination  Result Value Ref Range   WBC, UA None seen 0 -  5 /hpf   RBC, UA 11-30 (A) 0 -  2 /hpf   Epithelial Cells (non renal) 0-10 0 - 10 /hpf   Bacteria, UA Few None seen/Few  PSA  Result Value Ref Range   Prostate Specific Ag, Serum 0.7 0.0 - 4.0 ng/mL  Urinalysis, Complete  Result Value Ref Range   Specific Gravity, UA >1.030 (H) 1.005 - 1.030   pH, UA 5.0 5.0 - 7.5   Color, UA Yellow Yellow   Appearance Ur Clear Clear   Leukocytes, UA Negative Negative   Protein, UA Negative Negative/Trace   Glucose, UA Negative Negative   Ketones, UA Negative Negative   RBC, UA 3+ (A) Negative   Bilirubin, UA Negative Negative   Urobilinogen, Ur 0.2 0.2 - 1.0 mg/dL   Nitrite, UA Negative Negative   Microscopic Examination See below:    Lab Results  Component Value Date   WBC 9.4 09/25/2014   HCT 45.9 09/25/2014    Lab Results  Component Value Date   CREATININE 1.09 09/25/2014    No results found for: PSA  No results found for: TESTOSTERONE  No results found for: HGBA1C  Urinalysis    Component Value Date/Time   COLORURINE RED* 09/18/2014 1630   APPEARANCEUR TURBID* 09/18/2014 1630   LABSPEC 1.015 09/18/2014 1630   PHURINE 5.5 09/18/2014 1630   GLUCOSEU Negative 10/13/2014 1440   HGBUR 3+* 09/18/2014 1630   BILIRUBINUR Negative 10/13/2014 1440   BILIRUBINUR negative 09/25/2014 1604   BILIRUBINUR NEGATIVE 09/18/2014 1630   KETONESUR NEGATIVE 09/18/2014 1630   PROTEINUR negative 09/25/2014 1604   PROTEINUR 100* 09/18/2014 1630   UROBILINOGEN 0.2 09/25/2014 1604   NITRITE Negative 10/13/2014 1440   NITRITE negative 09/25/2014 1604   NITRITE NEGATIVE  09/18/2014 1630   LEUKOCYTESUR Negative 10/13/2014 1440   LEUKOCYTESUR Negative 09/25/2014 1604    Pertinent Imaging: CLINICAL DATA: Initial encounter for 1 month history of hematuria.  EXAM: CT ABDOMEN AND PELVIS WITHOUT AND WITH CONTRAST  TECHNIQUE: Multidetector CT imaging of the abdomen and pelvis was performed following the standard protocol before and following the bolus administration of intravenous contrast.  CONTRAST: OMNIPAQUE IOHEXOL 350 MG/ML SOLN  COMPARISON: None.  FINDINGS: Lower chest: Unremarkable.  Hepatobiliary: 3 mm hypodensity in the dome of the liver is too small to characterize but likely represents a tiny cyst. Liver parenchyma otherwise unremarkable. Tiny 1-2 mm stone is seen in the neck of the gallbladder (image 22 series  7). No intrahepatic or extrahepatic biliary dilation.  Pancreas: No focal mass lesion. No dilatation of the main duct. No intraparenchymal cyst. No peripancreatic edema.  Spleen: No splenomegaly. No focal mass lesion.  Adrenals/Urinary Tract: No adrenal nodule or mass. No evidence for an enhancing lesion in either kidney. No hydronephrosis.  No stones in the right kidney or ureter. 6 x 3 x 4 mm stone is seen at the left UPJ without hydronephrosis. 2 x 2 x 4 mm stone is seen in the upper pole of the left kidney and a 1 mm stone is seen in the interpolar region of the left kidney. No left ureteral stones. No bladder stones.  Delayed imaging shows no abnormality of either intrarenal collecting system. No wall thickening in either renal pelvis. Both ureters are well opacified without focal hydroureter, filling defect, or wall thickening. No focal bladder wall abnormality with a small segment of the posterior bladder wall incompletely evaluated due to adjacent non-opacified urine.  Stomach/Bowel: Stomach is nondistended. No gastric wall thickening. No evidence of outlet obstruction. Duodenum is normally  positioned as is the ligament of Treitz. No small bowel wall thickening. No small bowel dilatation. Terminal ileum is normal. The appendix is normal. Diverticular changes are noted in the left colon without evidence of diverticulitis.  Vascular/Lymphatic: There is abdominal aortic atherosclerosis without aneurysm. Portal vein is patent as is the superior mesenteric vein. There is a large venous collateral posterior to the stomach which courses from the portal vein to the left renal vein.  No abdominal lymphadenopathy. No evidence for pelvic sidewall lymphadenopathy.  Reproductive: Prostate gland and seminal vesicles are unremarkable.  Other: No intraperitoneal free fluid.  Musculoskeletal: Bone windows reveal no worrisome lytic or sclerotic osseous lesions.  IMPRESSION: 1. 6 x 3 x 4 mm stone identified at the left UPJ without obstruction. 2. 2 additional nonobstructing stones are seen in the left kidney. 3. No definite morphologic features of cirrhosis although there is a large collateral vein coursing from the portal vein to the left renal vein. This does raise the question of portal venous hypertension. 4. Probable 1-2 mm gallstone.   Electronically Signed  By: Kennith Center M.D.  On: 10/22/2014 16:51  CLINICAL DATA: Left ureteral stone.  EXAM: ABDOMEN - 1 VIEW  COMPARISON: CT abdomen and pelvis 10/22/2014  FINDINGS: Focal calcifications lateral to the L2-3 disc space on the left likely represent previously noted renal calculus. No new stones are present. The bowel gas pattern is normal. The axial skeleton is unremarkable.  IMPRESSION: 1. Calcifications projected near the left renal hilum suggest the previously noted left UPJ stone has not moved. 2. No new calcifications.   Electronically Signed  By: Marin Roberts M.D.  On: 11/13/2014 15:27  Assessment & Plan:    1. Left UPJ stone:   Patient was found to a left UPJ stone on CT  Urogram.  We discussed ESWL and URS/LL/stent placement.  I explained  each procedure to the patient and the potential risks.  Patient wants to pursue ESWL.  I explained to the patient that the ESWL may not rid him of the stone.  He may need another procedure after the ESWL for definitive treatment.  He may also need a stent after undergoing ESWL if the particles of the treated stone get lodged in the ureter.  He understands the risk of the ESWL and wishes to proceed with the ESWL.    2. Gross hematuria:   Patient will continued to be monitored to make  sure the hematuria does not persist after the stone has been treated.    - Urinalysis, Complete  3. Family history of prostate cancer:  Patient's father was diagnosed with prostate cancer at his late 44's. He does not have a prior PSA.  We will draw a PSA today.     No Follow-up on file.  Michiel Cowboy, PA-C  [facility] [address] [place], [place] [address] [phone]

## 2014-11-20 NOTE — Discharge Instructions (Signed)
See [facility] discharge instructions in chart.  Lithotripsy, Care After Refer to this sheet in the next few weeks. These instructions provide you with information on caring for yourself after your procedure. Your health care provider may also give you more specific instructions. Your treatment has been planned according to current medical practices, but problems sometimes occur. Call your health care provider if you have any problems or questions after your procedure. WHAT TO EXPECT AFTER THE PROCEDURE   Your urine may have a red tinge for a few days after treatment. Blood loss is usually minimal.  You may have soreness in the back or flank area. This usually goes away after a few days. The procedure can cause blotches or bruises on the back where the pressure wave enters the skin. These marks usually cause only minimal discomfort and should disappear in a short time.  Stone fragments should begin to pass within 24 hours of treatment. However, a delayed passage is not unusual.  You may have pain, discomfort, and feel sick to your stomach (nauseated) when the crushed fragments of stone are passed down the tube from the kidney to the bladder. Stone fragments can pass soon after the procedure and may last for up to 4-8 weeks.  A small number of patients may have severe pain when stone fragments are not able to pass, which leads to an obstruction.  If your stone is greater than 1 inch (2.5 cm) in diameter or if you have multiple stones that have a combined diameter greater than 1 inch (2.5 cm), you may require more than one treatment.  If you had a stent placed prior to your procedure, you may experience some discomfort, especially during urination. You may experience the pain or discomfort in your flank or back, or you may experience a sharp pain or discomfort at the base of your penis or in your lower abdomen. The discomfort usually lasts only a few minutes after urinating. HOME CARE  INSTRUCTIONS   Rest at home until you feel your energy improving.  Only take over-the-counter or prescription medicines for pain, discomfort, or fever as directed by your health care provider. Depending on the type of lithotripsy, you may need to take antibiotics and anti-inflammatory medicines for a few days.  Drink enough water and fluids to keep your urine clear or pale yellow. This helps "flush" your kidneys. It helps pass any remaining pieces of stone and prevents stones from coming back.  Most people can resume daily activities within 1-2 days after standard lithotripsy. It can take longer to recover from laser and percutaneous lithotripsy.  If the stones are in your urinary system, you may be asked to strain your urine at home to look for stones. Any stones that are found can be sent to a medical lab for examination.  Visit your health care provider for a follow-up appointment in a few weeks. Your doctor may remove your stent if you have one. Your health care provider will also check to see whether stone particles still remain. SEEK MEDICAL CARE IF:   Your pain is not relieved by medicine.  You have a lasting nauseous feeling.  You feel there is too much blood in the urine.  You develop persistent problems with frequent or painful urination that does not at least partially improve after 2 days following the procedure.  You have a congested cough.  You feel lightheaded.  You develop a rash or any other signs that might suggest an allergic problem.  You develop any reaction or side effects to your medicine(s). SEEK IMMEDIATE MEDICAL CARE IF:   You experience severe back or flank pain or both.  You see nothing but blood when you urinate.  You cannot pass any urine at all.  You have a fever or shaking chills.  You develop shortness of breath, difficulty breathing, or chest pain.  You develop vomiting that will not stop after 6-8 hours.  You have a fainting  episode. Document Released: 04/24/2007 Document Revised: 01/23/2013 Document Reviewed: 10/18/2012 [facility] Patient Information 2015 [place], [place]. This information is not intended to replace advice given to you by your health care provider. Make sure you discuss any questions you have with your health care provider.  AMBULATORY SURGERY  DISCHARGE INSTRUCTIONS   1) The drugs that you were given will stay in your system until tomorrow so for the next 24 hours you should not:  A) Drive an automobile B) Make any legal decisions C) Drink any alcoholic beverage   2) You may resume regular meals tomorrow.  Today it is better to start with liquids and gradually work up to solid foods.  You may eat anything you prefer, but it is better to start with liquids, then soup and crackers, and gradually work up to solid foods.   3) Please notify your doctor immediately if you have any unusual bleeding, trouble breathing, redness and pain at the surgery site, drainage, fever, or pain not relieved by medication.    4) Additional Instructions:        Please contact your physician with any problems or Same Day Surgery at [phone], Monday through Friday 6 am to 4 pm, or [place] [facility] number at [phone].

## 2014-12-04 ENCOUNTER — Ambulatory Visit (INDEPENDENT_AMBULATORY_CARE_PROVIDER_SITE_OTHER): Payer: 59 | Admitting: Urology

## 2014-12-04 ENCOUNTER — Encounter: Payer: Self-pay | Admitting: Urology

## 2014-12-04 VITALS — BP 113/77 | HR 87 | Ht 70.5 in | Wt 190.4 lb

## 2014-12-04 DIAGNOSIS — Z8042 Family history of malignant neoplasm of prostate: Secondary | ICD-10-CM

## 2014-12-04 DIAGNOSIS — N201 Calculus of ureter: Secondary | ICD-10-CM

## 2014-12-04 DIAGNOSIS — R310 Gross hematuria: Secondary | ICD-10-CM

## 2014-12-04 LAB — MICROSCOPIC EXAMINATION
Bacteria, UA: NONE SEEN
EPITHELIAL CELLS (NON RENAL): NONE SEEN /HPF (ref 0–10)
WBC, UA: NONE SEEN /hpf (ref 0–?)

## 2014-12-04 LAB — URINALYSIS, COMPLETE
Bilirubin, UA: NEGATIVE
Glucose, UA: NEGATIVE
KETONES UA: NEGATIVE
LEUKOCYTES UA: NEGATIVE
Nitrite, UA: NEGATIVE
PH UA: 5.5 (ref 5.0–7.5)
Protein, UA: NEGATIVE
SPEC GRAV UA: 1.025 (ref 1.005–1.030)
Urobilinogen, Ur: 0.2 mg/dL (ref 0.2–1.0)

## 2014-12-04 NOTE — Progress Notes (Signed)
4:04 PM   Steven Vega [birth year] [id number]  Referring provider: Schuyler Amor, MD [address] [place], [place] [address]  Chief Complaint  Patient presents with  . Follow-up    2 week after ESWL    HPI: Steven Vega is a 54 year old white male who underwent ESWL on 11/20/2014 for a left UPJ stone with Dr. Apolinar Junes.  Post procedural course was uneventful.  He passed some flecks and did not have significant flank pain or hematuria.    Previous history: Patient presented with a 3 week  history of painless gross hematuria one month ago. He states nothing precipitated the event. He just went to urinate and he urinated a very dark brown urine.   He did have 3-10 RBC's per power field on his urinalysis on 10/13/2014 and 11-30 RBC's/hpf on 11/13/2014.  He was also experiencing left sided flank pain.    His baseline urinary symptoms are and frequent nocturia, and frequent intermittency and infrequent weak stream. Does not have a history of urinary tract infections, kidney stones or GU malignancies. Father does have a history of prostate cancer diagnosed in his late 33's.  He has not had a prostate exam he does not have a recent PSA.  Today, he is feeling well with no complaints.  He denies fevers, chills, nausea or vomiting.  He is also not experiencing any gross hematuria, dysuria or flank pain.    PMH: Past Medical History  Diagnosis Date  . Glaucoma   . Kidney stones     Surgical History: Past Surgical History  Procedure Laterality Date  . Hand tendon surgery Left     thumb  . Tonsillectomy    . Tonsillectomy Bilateral     3rd grade  . Tendon repair Left     54yrs old  . No past surgeries    . Extracorporeal shock wave lithotripsy  11/2014    Home Medications:    Medication List       This list is accurate as of: 12/04/14  4:04 PM.  Always use your most recent med list.               cephALEXin 500 MG capsule  Commonly known as:  KEFLEX    Take 500 mg by mouth 4 (four) times daily.     latanoprost 0.005 % ophthalmic solution  Commonly known as:  XALATAN  1 drop at bedtime.     oxyCODONE-acetaminophen 5-325 MG per tablet  Commonly known as:  PERCOCET  Take 1-2 tablets by mouth every 8 (eight) hours as needed for severe pain.     tamsulosin 0.4 MG Caps capsule  Commonly known as:  FLOMAX  Take 1 capsule (0.4 mg total) by mouth daily.        Allergies: No Known Allergies  Family History: Family History  Problem Relation Age of Onset  . Breast cancer Mother   . Prostate cancer Father   . Lung cancer Mother   . Kidney disease Neg Hx     Social History:  reports that he quit smoking about 5 years ago. His smoking use included Cigarettes. He started smoking about 25 years ago. He has a 20 pack-year smoking history. His smokeless tobacco use includes Chew. He reports that he does not drink alcohol or use illicit drugs.  ROS: Urological Symptom Review  Patient is experiencing the following symptoms: Get up at night to urinate Leakage of urine Blood in urine Weak stream  Review of Systems  Gastrointestinal (upper)  : Negative for upper GI symptoms  Gastrointestinal (lower) : Negative for lower GI symptoms  Constitutional : Negative for symptoms  Skin: Negative for skin symptoms  Eyes: Negative for eye symptoms  Ear/Nose/Throat : Negative for Ear/Nose/Throat symptoms  Hematologic/Lymphatic: Negative for Hematologic/Lymphatic symptoms  Cardiovascular : Negative for cardiovascular symptoms  Respiratory : Negative for respiratory symptoms  Endocrine: Negative for endocrine symptoms  Musculoskeletal: Negative for musculoskeletal symptoms  Neurological: Negative for neurological symptoms  Psychologic: Negative for psychiatric symptoms   Physical Exam: Blood pressure 113/77, pulse 87, height 5' 10.5" (1.791 m), weight 190 lb 6.4 oz (86.365 kg).  Laboratory Data: Results for orders  placed or performed in visit on 12/04/14  Microscopic Examination  Result Value Ref Range   WBC, UA None seen 0 -  5 /hpf   RBC, UA 0-2 0 -  2 /hpf   Epithelial Cells (non renal) None seen 0 - 10 /hpf   Mucus, UA Present (A) Not Estab.   Bacteria, UA None seen None seen/Few  Urinalysis, Complete  Result Value Ref Range   Specific Gravity, UA 1.025 1.005 - 1.030   pH, UA 5.5 5.0 - 7.5   Color, UA Yellow Yellow   Appearance Ur Clear Clear   Leukocytes, UA Negative Negative   Protein, UA Negative Negative/Trace   Glucose, UA Negative Negative   Ketones, UA Negative Negative   RBC, UA Trace (A) Negative   Bilirubin, UA Negative Negative   Urobilinogen, Ur 0.2 0.2 - 1.0 mg/dL   Nitrite, UA Negative Negative   Microscopic Examination See below:    Lab Results  Component Value Date   WBC 9.4 09/25/2014   HCT 45.9 09/25/2014    Lab Results  Component Value Date   CREATININE 1.09 09/25/2014    Lab Results  Component Value Date   PSA 0.7 11/13/2014     Pertinent Imaging: CLINICAL DATA: Initial encounter for 1 month history of hematuria.  EXAM: CT ABDOMEN AND PELVIS WITHOUT AND WITH CONTRAST  TECHNIQUE: Multidetector CT imaging of the abdomen and pelvis was performed following the standard protocol before and following the bolus administration of intravenous contrast.  CONTRAST: OMNIPAQUE IOHEXOL 350 MG/ML SOLN  COMPARISON: None.  FINDINGS: Lower chest: Unremarkable.  Hepatobiliary: 3 mm hypodensity in the dome of the liver is too small to characterize but likely represents a tiny cyst. Liver parenchyma otherwise unremarkable. Tiny 1-2 mm stone is seen in the neck of the gallbladder (image 22 series 7). No intrahepatic or extrahepatic biliary dilation.  Pancreas: No focal mass lesion. No dilatation of the main duct. No intraparenchymal cyst. No peripancreatic edema.  Spleen: No splenomegaly. No focal mass lesion.  Adrenals/Urinary Tract:  No adrenal nodule or mass. No evidence for an enhancing lesion in either kidney. No hydronephrosis.  No stones in the right kidney or ureter. 6 x 3 x 4 mm stone is seen at the left UPJ without hydronephrosis. 2 x 2 x 4 mm stone is seen in the upper pole of the left kidney and a 1 mm stone is seen in the interpolar region of the left kidney. No left ureteral stones. No bladder stones.  Delayed imaging shows no abnormality of either intrarenal collecting system. No wall thickening in either renal pelvis. Both ureters are well opacified without focal hydroureter, filling defect, or wall thickening. No focal bladder wall abnormality with a small segment of the posterior bladder wall incompletely evaluated due to adjacent non-opacified  urine.  Stomach/Bowel: Stomach is nondistended. No gastric wall thickening. No evidence of outlet obstruction. Duodenum is normally positioned as is the ligament of Treitz. No small bowel wall thickening. No small bowel dilatation. Terminal ileum is normal. The appendix is normal. Diverticular changes are noted in the left colon without evidence of diverticulitis.  Vascular/Lymphatic: There is abdominal aortic atherosclerosis without aneurysm. Portal vein is patent as is the superior mesenteric vein. There is a large venous collateral posterior to the stomach which courses from the portal vein to the left renal vein.  No abdominal lymphadenopathy. No evidence for pelvic sidewall lymphadenopathy.  Reproductive: Prostate gland and seminal vesicles are unremarkable.  Other: No intraperitoneal free fluid.  Musculoskeletal: Bone windows reveal no worrisome lytic or sclerotic osseous lesions.  IMPRESSION: 1. 6 x 3 x 4 mm stone identified at the left UPJ without obstruction. 2. 2 additional nonobstructing stones are seen in the left kidney. 3. No definite morphologic features of cirrhosis although there is a large collateral vein coursing from  the portal vein to the left renal vein. This does raise the question of portal venous hypertension. 4. Probable 1-2 mm gallstone.   Electronically Signed  By: Kennith Center M.D.  On: 10/22/2014 16:51  CLINICAL DATA: Left ureteral stone.  EXAM: ABDOMEN - 1 VIEW  COMPARISON: CT abdomen and pelvis 10/22/2014  FINDINGS: Focal calcifications lateral to the L2-3 disc space on the left likely represent previously noted renal calculus. No new stones are present. The bowel gas pattern is normal. The axial skeleton is unremarkable.  IMPRESSION: 1. Calcifications projected near the left renal hilum suggest the previously noted left UPJ stone has not moved. 2. No new calcifications.   Electronically Signed  By: Marin Roberts M.D.  On: 11/13/2014 15:27  Assessment & Plan:    1. Left UPJ stone:   Patient underwent ESWL for a left UPJ stone. His postprocedural course was uneventful. He did not capture any large fragments for analysis. He will have a KUB and RUS and will return in 1 month to discuss the report.  2. Gross hematuria:   Patient will continued to be monitored to make sure the hematuria does not persist after the stone has been treated.  Patient has not had any further gross hematuria and he has no hematuria on today's urinalysis we will continue to monitor.  - Urinalysis, Complete  3. Family history of prostate cancer:  Patient's father was diagnosed with prostate cancer at his late 34's.  His DRE was benign and his recent PSA was 0.7 ng/mL on 11/13/2014.  We will follow annually with DRE and PSAs.  4. Findings suggestive of portal venous hypertension found on CT:   Findings of unknown significance. I will forward this note to his PCP, Dr. Hollace Hayward.   Return for RUS and KUB report in one month.  Michiel Cowboy, PA-C  [facility] [address], [address] [place], [place] [address] [phone]

## 2014-12-13 DIAGNOSIS — R310 Gross hematuria: Secondary | ICD-10-CM | POA: Insufficient documentation

## 2014-12-13 DIAGNOSIS — Z8042 Family history of malignant neoplasm of prostate: Secondary | ICD-10-CM | POA: Insufficient documentation

## 2014-12-26 ENCOUNTER — Encounter: Payer: Self-pay | Admitting: Urology

## 2015-10-30 ENCOUNTER — Encounter: Payer: Self-pay | Admitting: Gynecology

## 2015-10-30 ENCOUNTER — Ambulatory Visit
Admission: EM | Admit: 2015-10-30 | Discharge: 2015-10-30 | Disposition: A | Discharge status: Home / Self Care | Payer: 59 | Attending: Emergency Medicine | Admitting: Emergency Medicine

## 2015-10-30 DIAGNOSIS — S0101XA Laceration without foreign body of scalp, initial encounter: Secondary | ICD-10-CM | POA: Diagnosis not present

## 2015-10-30 MED ORDER — BACITRACIN-POLYMYXIN B 500-10000 UNIT/GM EX OINT: TOPICAL_OINTMENT | Freq: Two times a day (BID) | CUTANEOUS | Status: DC

## 2015-10-30 MED ORDER — CEPHALEXIN 500 MG PO CAPS: 500.0000 mg | ORAL_CAPSULE | Freq: Four times a day (QID) | ORAL | Status: DC

## 2015-10-30 NOTE — Discharge Instructions (Signed)
You may use bacitracin or Polysporin on the laceration well his heals. Follow up with us in 8-10 days to have the staples removed. Return here sooner for any signs of infection.
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
## 2015-10-30 NOTE — ED Notes (Signed)
Per patient while at [place] injury top on head when he stood up on a piece of metal. Per patient last tetanus hot was within a year.

## 2015-11-09 ENCOUNTER — Ambulatory Visit
Admission: EM | Admit: 2015-11-09 | Discharge: 2015-11-09 | Disposition: A | Discharge status: Home / Self Care | Payer: 59

## 2015-11-09 DIAGNOSIS — Z4802 Encounter for removal of sutures: Secondary | ICD-10-CM

## 2015-11-09 NOTE — ED Triage Notes (Signed)
Staples Removal

## 2015-11-09 NOTE — ED Notes (Signed)
Nurse removed 3 staples from patients head. The laceration looks, clean, dry and is healing well.

## 2016-07-04 ENCOUNTER — Encounter: Payer: Self-pay | Admitting: *Deleted

## 2016-07-04 ENCOUNTER — Ambulatory Visit
Admission: EM | Admit: 2016-07-04 | Discharge: 2016-07-04 | Disposition: A | Discharge status: Home / Self Care | Payer: 59 | Attending: Family Medicine | Admitting: Family Medicine

## 2016-07-04 DIAGNOSIS — Z0289 Encounter for other administrative examinations: Secondary | ICD-10-CM

## 2016-07-04 LAB — DEPT OF TRANSP DIPSTICK, URINE (ARMC ONLY)
Glucose, UA: NEGATIVE mg/dL
Hgb urine dipstick: NEGATIVE
Protein, ur: NEGATIVE mg/dL
Specific Gravity, Urine: 1.025 (ref 1.005–1.030)

## 2016-07-04 NOTE — ED Provider Notes (Signed)
[facility]    CSN: [id number] Arrival date & time: 07/04/16  1758     History   Chief Complaint Chief Complaint  Patient presents with  . Commercial Driver's License Exam    HPI Steven Vega is a 56 y.o. male.   Patient here for DOT Physical (see scanned form)   The history is provided by the patient.    Past Medical History:  Diagnosis Date  . Glaucoma   . Kidney stones     Patient Active Problem List   Diagnosis Date Noted  . Gross hematuria 12/13/2014  . Family history of prostate cancer 12/13/2014  . Left ureteral stone 11/14/2014  . Family history of prostate cancer in father 10/13/2014  . Hematuria, undiagnosed cause 09/25/2014  . Glaucoma 09/25/2014  . FH: heart disease 09/25/2014  . FH: colon cancer 09/25/2014  . FH: lung cancer 09/25/2014    Past Surgical History:  Procedure Laterality Date  . EXTRACORPOREAL SHOCK WAVE LITHOTRIPSY  11/2014  . EXTRACORPOREAL SHOCK WAVE LITHOTRIPSY Left 11/20/2014   Procedure: EXTRACORPOREAL SHOCK WAVE LITHOTRIPSY (ESWL);  Surgeon: Vanna Scotland, MD;  Location: [facility] ORS;  Service: Urology;  Laterality: Left;  . HAND TENDON SURGERY Left    thumb  . NO PAST SURGERIES    . TENDON REPAIR Left    56yrs old  . TONSILLECTOMY    . TONSILLECTOMY Bilateral    3rd grade       Home Medications    Prior to Admission medications   Medication Sig Start Date End Date Taking? Authorizing Provider  bacitracin-polymyxin b (POLYSPORIN) ointment Apply topically 2 (two) times daily. 10/30/15   Domenick Gong, MD  cephALEXin (KEFLEX) 500 MG capsule Take 1 capsule (500 mg total) by mouth 4 (four) times daily. X 10 days 10/30/15   Domenick Gong, MD  latanoprost (XALATAN) 0.005 % ophthalmic solution 1 drop at bedtime.    Historical Provider, MD  tamsulosin (FLOMAX) 0.4 MG CAPS capsule Take 1 capsule (0.4 mg total) by mouth daily. 11/20/14   Vanna Scotland, MD    Family History Family History  Problem  Relation Age of Onset  . Breast cancer Mother   . Lung cancer Mother   . Prostate cancer Father   . Kidney disease Neg Hx     Social History Social History  Substance Use Topics  . Smoking status: Former Smoker    Packs/day: 1.00    Years: 20.00    Types: Cigarettes    Start date: 09/24/1989    Quit date: 09/24/2009  . Smokeless tobacco: Current User    Types: Chew  . Alcohol use No     Allergies   Patient has no known allergies.   Review of Systems Review of Systems   Physical Exam Triage Vital Signs ED Triage Vitals  Enc Vitals Group     BP 07/04/16 1823 131/77     Pulse Rate 07/04/16 1823 73     Resp 07/04/16 1823 16     Temp 07/04/16 1823 98.3 F (36.8 C)     Temp Source 07/04/16 1823 Oral     SpO2 07/04/16 1823 100 %     Weight 07/04/16 1825 185 lb (83.9 kg)     Height 07/04/16 1825 5\' 11"  (1.803 m)     Head Circumference --      Peak Flow --      Pain Score --      Pain Loc --      Pain Edu? --  Excl. in GC? --    No data found.   Updated Vital Signs BP 131/77 (BP Location: Left Arm)   Pulse 73   Temp 98.3 F (36.8 C) (Oral)   Resp 16   Ht 5\' 11"  (1.803 m)   Wt 185 lb (83.9 kg)   SpO2 100%   BMI 25.80 kg/m   Visual Acuity Right Eye Distance:   Left Eye Distance:   Bilateral Distance:    Right Eye Near:   Left Eye Near:    Bilateral Near:     Physical Exam   UC Treatments / Results  Labs (all labs ordered are listed, but only abnormal results are displayed) Labs Reviewed  DEPT OF TRANSP DIPSTICK, URINE([facility] ONLY)    EKG  EKG Interpretation None       Radiology No results found.  Procedures Procedures (including critical care time)  Medications Ordered in UC Medications - No data to display   Initial Impression / Assessment and Plan / UC Course  I have reviewed the triage vital signs and the nursing notes.  Pertinent labs & imaging results that were available during my care of the patient were  reviewed by me and considered in my medical decision making (see chart for details).       Final Clinical Impressions(s) / UC Diagnoses   Final diagnoses:  Encounter for examination required by Department of Transportation (DOT)    New Prescriptions New Prescriptions   No medications on file   DOT Physical (medically qualified for 3 months; patient reports a history of "questionable" glaucoma but has stopped the drops prescribed by ophthalmologist and has not seen the ophthalmologist in over 1 year; explained to patient that he needs to see his ophthalmologist and bring documentation as to status); see scanned form)   [name] [name], MD 07/04/16 1918

## 2016-07-04 NOTE — ED Triage Notes (Signed)
DOT physical. 

## 2016-11-14 ENCOUNTER — Ambulatory Visit
Admission: EM | Admit: 2016-11-14 | Discharge: 2016-11-14 | Disposition: A | Discharge status: Home / Self Care | Payer: 59 | Attending: Family Medicine | Admitting: Family Medicine

## 2016-11-14 DIAGNOSIS — Z024 Encounter for examination for driving license: Secondary | ICD-10-CM

## 2016-11-14 DIAGNOSIS — Z0289 Encounter for other administrative examinations: Secondary | ICD-10-CM

## 2016-11-14 LAB — DEPT OF TRANSP DIPSTICK, URINE (ARMC ONLY)
Glucose, UA: NEGATIVE mg/dL
PROTEIN: NEGATIVE mg/dL
Specific Gravity, Urine: 1.025 (ref 1.005–1.030)

## 2016-11-14 NOTE — ED Triage Notes (Signed)
Patient is here for DOT physical. Patient has no other complaints today.  

## 2016-11-14 NOTE — ED Provider Notes (Signed)
[facility]    CSN: [id number] Arrival date & time: 11/14/16  0903     History   Chief Complaint Chief Complaint  Patient presents with  . Commercial Driver's License Exam    HPI Steven Vega is a 56 y.o. male.   Patient here for DOT Physical (see scanned form)   The history is provided by the patient.    Past Medical History:  Diagnosis Date  . Glaucoma   . Kidney stones     Patient Active Problem List   Diagnosis Date Noted  . Gross hematuria 12/13/2014  . Family history of prostate cancer 12/13/2014  . Left ureteral stone 11/14/2014  . Family history of prostate cancer in father 10/13/2014  . Hematuria, undiagnosed cause 09/25/2014  . Glaucoma 09/25/2014  . FH: heart disease 09/25/2014  . FH: colon cancer 09/25/2014  . FH: lung cancer 09/25/2014    Past Surgical History:  Procedure Laterality Date  . EXTRACORPOREAL SHOCK WAVE LITHOTRIPSY  11/2014  . EXTRACORPOREAL SHOCK WAVE LITHOTRIPSY Left 11/20/2014   Procedure: EXTRACORPOREAL SHOCK WAVE LITHOTRIPSY (ESWL);  Surgeon: Vanna ScotlandAshley Brandon, MD;  Location: [facility] ORS;  Service: Urology;  Laterality: Left;  . HAND TENDON SURGERY Left    thumb  . NO PAST SURGERIES    . TENDON REPAIR Left    [age]yrs old  . TONSILLECTOMY    . TONSILLECTOMY Bilateral    3rd grade       Home Medications    Prior to Admission medications   Medication Sig Start Date End Date Taking? Authorizing Provider  latanoprost (XALATAN) 0.005 % ophthalmic solution 1 drop at bedtime.   Yes [provider]  bacitracin-polymyxin b (POLYSPORIN) ointment Apply topically 2 (two) times daily. 10/30/15   Domenick GongMortenson, Ashley, MD  cephALEXin (KEFLEX) 500 MG capsule Take 1 capsule (500 mg total) by mouth 4 (four) times daily. X 10 days 10/30/15   Domenick GongMortenson, Ashley, MD  tamsulosin (FLOMAX) 0.4 MG CAPS capsule Take 1 capsule (0.4 mg total) by mouth daily. 11/20/14   Vanna ScotlandBrandon, Ashley, MD    Family History Family History  Problem  Relation Age of Onset  . Breast cancer Mother   . Lung cancer Mother   . Prostate cancer Father   . Kidney disease Neg Hx     Social History Social History  Substance Use Topics  . Smoking status: Former Smoker    Packs/day: 1.00    Years: 20.00    Types: Cigarettes    Start date: 09/24/1989    Quit date: 09/24/2009  . Smokeless tobacco: Current User    Types: Chew  . Alcohol use No     Allergies   Patient has no known allergies.   Review of Systems Review of Systems   Physical Exam Triage Vital Signs ED Triage Vitals [11/14/16 0926]  Enc Vitals Group     BP 113/88     Pulse Rate 79     Resp 18     Temp 97.9 F (36.6 C)     Temp Source Oral     SpO2 98 %     Weight 186 lb 3.2 oz (84.5 kg)     Height 5' 10.5" (1.791 m)     Head Circumference      Peak Flow      Pain Score 0     Pain Loc      Pain Edu?      Excl. in GC?    No data found.  Updated Vital Signs BP 113/88 (BP Location: Left Arm)   Pulse 79   Temp 97.9 F (36.6 C) (Oral)   Resp 18   Ht 5' 10.5" (1.791 m)   Wt 186 lb 3.2 oz (84.5 kg)   SpO2 98%   BMI 26.34 kg/m   Visual Acuity Right Eye Distance:   Left Eye Distance:   Bilateral Distance:    Right Eye Near:   Left Eye Near:    Bilateral Near:     Physical Exam   UC Treatments / Results  Labs (all labs ordered are listed, but only abnormal results are displayed) Labs Reviewed  DEPT OF TRANSP DIPSTICK, URINE([facility] ONLY) - Abnormal; Notable for the following:       Result Value   Hgb urine dipstick TRACE (*)    All other components within normal limits    EKG  EKG Interpretation None       Radiology No results found.  Procedures Procedures (including critical care time)  Medications Ordered in UC Medications - No data to display   Initial Impression / Assessment and Plan / UC Course  I have reviewed the triage vital signs and the nursing notes.  Pertinent labs & imaging results that were  available during my care of the patient were reviewed by me and considered in my medical decision making (see chart for details).       Final Clinical Impressions(s) / UC Diagnoses   Final diagnoses:  Encounter for examination required by Department of Transportation (DOT)    New Prescriptions New Prescriptions   No medications on file   DOT Physical (medically qualified for 2 years; see scanned form)   Payton Mccallumonty, Debraann Livingstone, MD 11/14/16 1000

## 2017-03-03 IMAGING — CT CT ABD-PEL WO/W CM
2 of 10 series · 11 of 46 positions shown, 17 images · IV contrast (omnipaque)
Comparison: None.

CLINICAL DATA: Initial encounter for 1 month history of hematuria.

EXAM:
CT ABDOMEN AND PELVIS WITHOUT AND WITH CONTRAST
TECHNIQUE: Multidetector CT imaging of the abdomen and pelvis was performed
following the standard protocol before and following the bolus
administration of intravenous contrast.
CONTRAST:  150mL OMNIPAQUE IOHEXOL 350 MG/ML SOLN

[Series 5: cor hematuria > 45 wo · coronal · 0.75mm/px · 2 of 151 slices shown]
[im 51/151  soft-tissue]
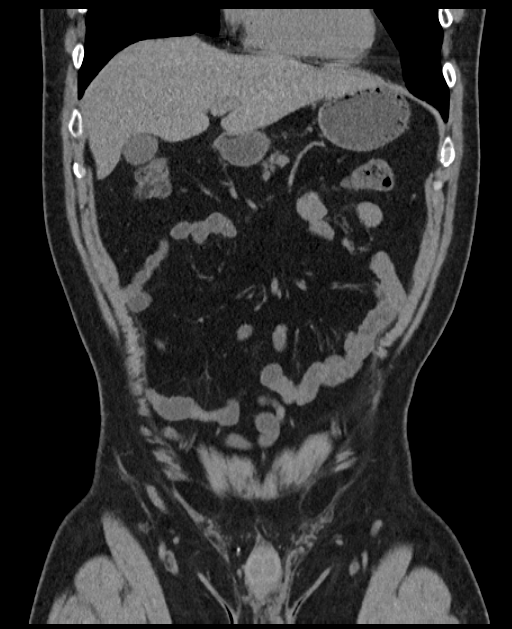
[im 101/151  soft-tissue]
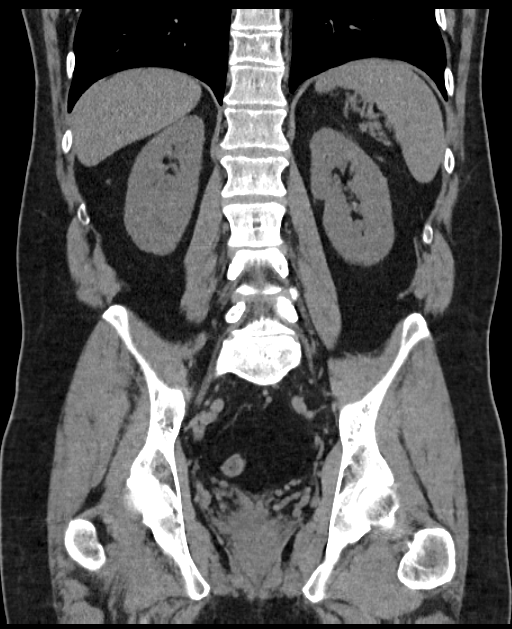

[Series 12: delay · axial · delayed · 0.77mm/px · z∈[+76,+481]mm · 9 of 103 slices shown, 15 images]
[im 11/103  soft-tissue]
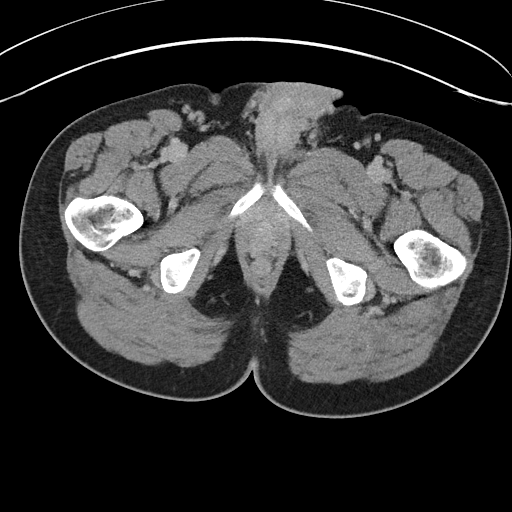
[im 11/103  bone]
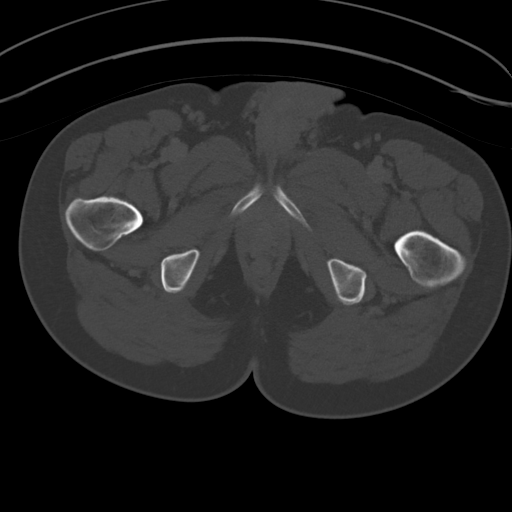
[im 21/103  soft-tissue]
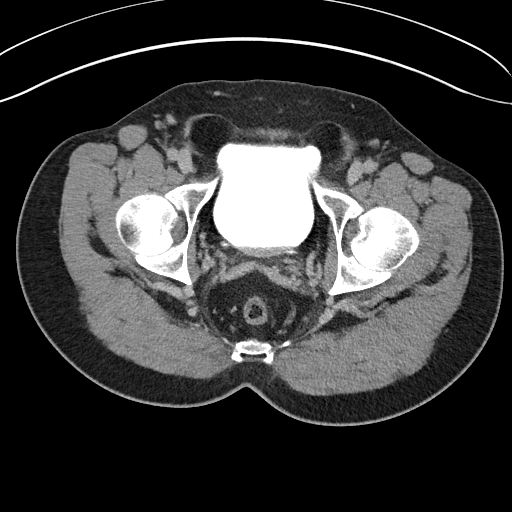
[im 31/103  soft-tissue]
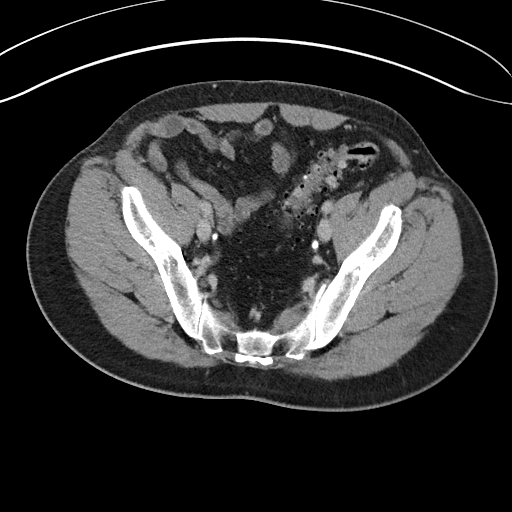
[im 41/103  soft-tissue]
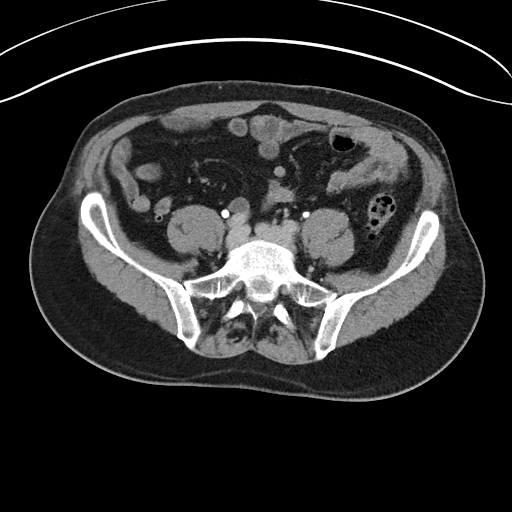
[im 52/103  soft-tissue]
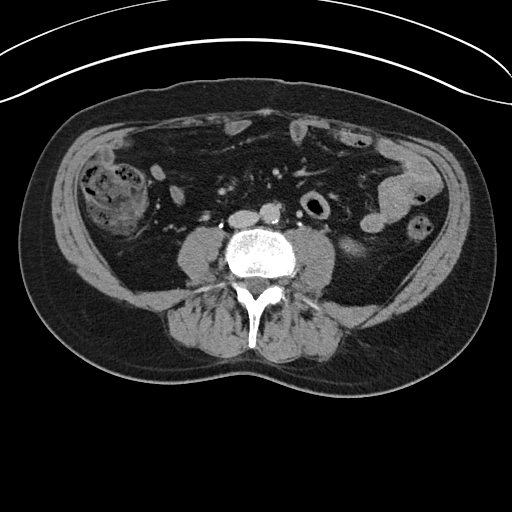
[im 62/103  soft-tissue]
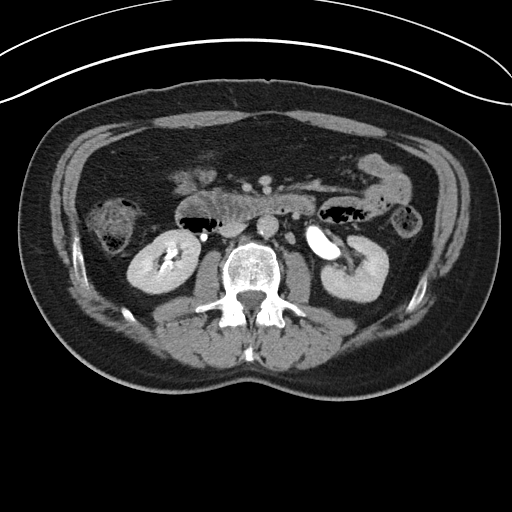
[im 62/103  lung]
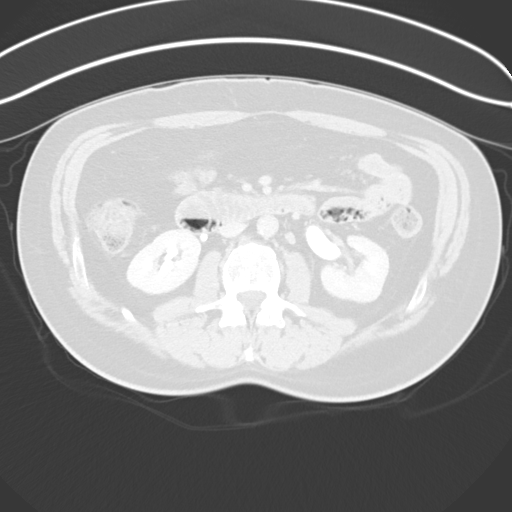
[im 72/103  soft-tissue]
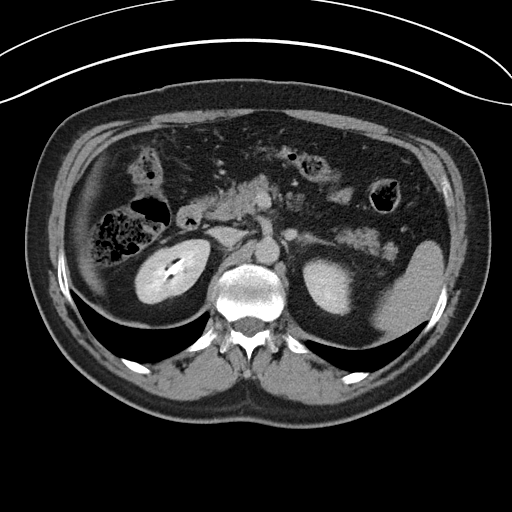
[im 72/103  lung]
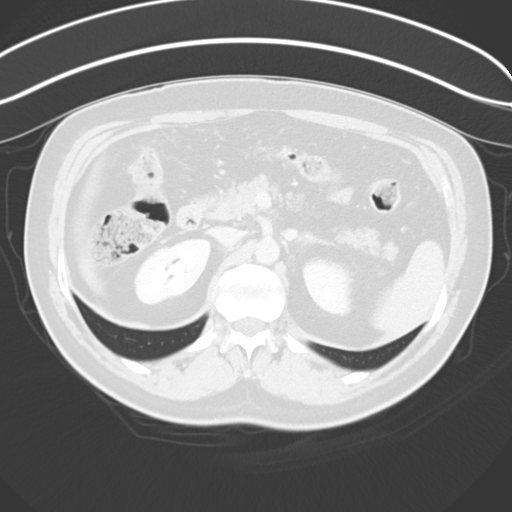
[im 82/103  soft-tissue]
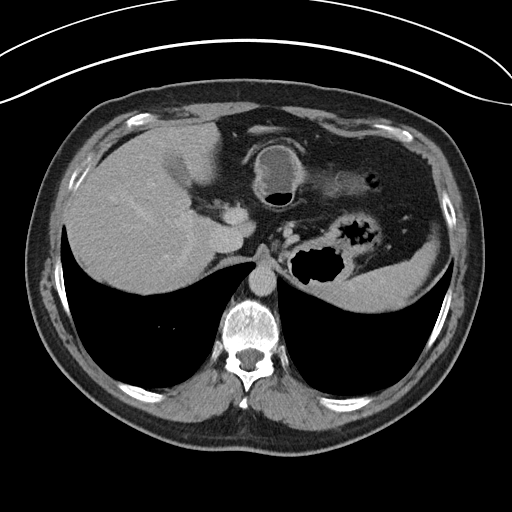
[im 82/103  lung]
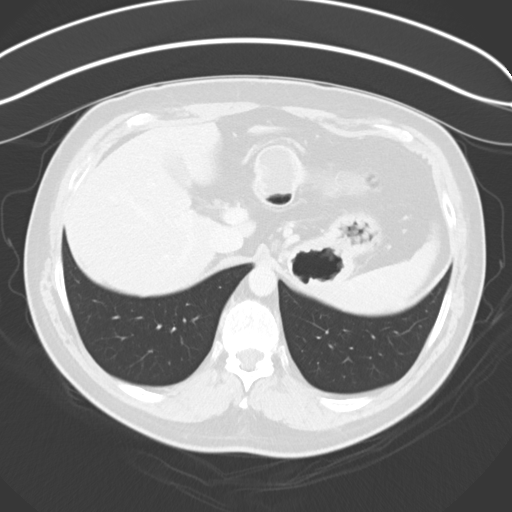
[im 92/103  soft-tissue]
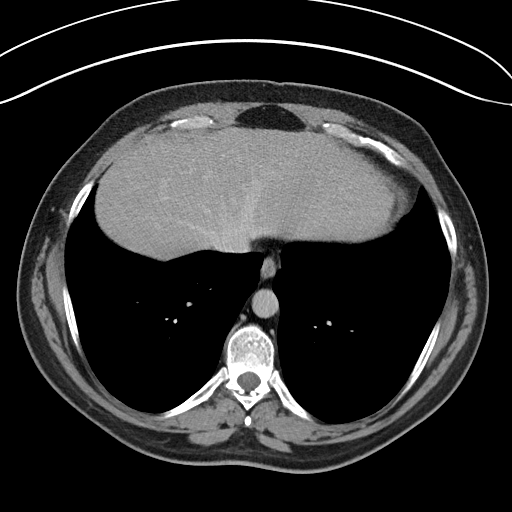
[im 92/103  lung]
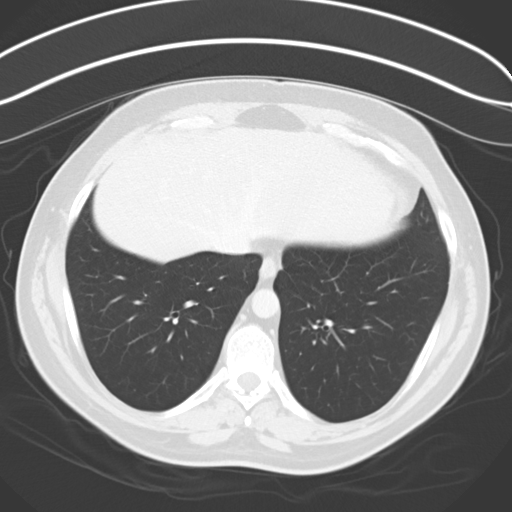
[im 92/103  bone]
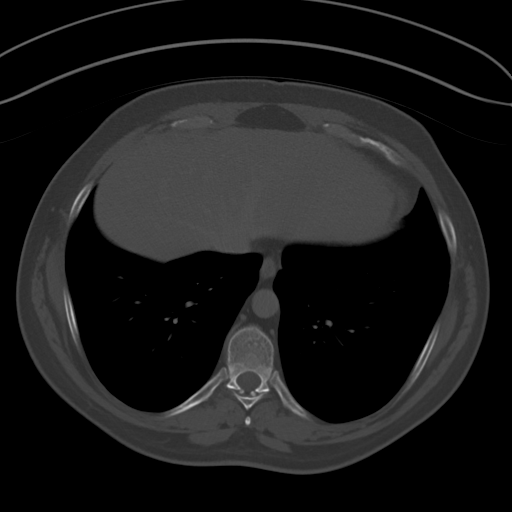

[11 of 46 positions shown; findings below may reference images not displayed]

FINDINGS: Lower chest:  Unremarkable.

Hepatobiliary: 3 mm hypodensity in the dome of the liver is too
small to characterize but likely represents a tiny cyst. Liver
parenchyma otherwise unremarkable. Tiny 1-2 mm stone is seen in the
neck of the gallbladder (image 22 series 7). No intrahepatic or
extrahepatic biliary dilation.

Pancreas: No focal mass lesion. No dilatation of the main duct. No
intraparenchymal cyst. No peripancreatic edema.

Spleen: No splenomegaly. No focal mass lesion.

Adrenals/Urinary Tract: No adrenal nodule or mass. No evidence for
an enhancing lesion in either kidney. No hydronephrosis.

No stones in the right kidney or ureter. 6 x 3 x 4 mm stone is seen
at the left UPJ without hydronephrosis. 2 x 2 x 4 mm stone is seen
in the upper pole of the left kidney and a 1 mm stone is seen in the
interpolar region of the left kidney. No left ureteral stones. No
bladder stones.

Delayed imaging shows no abnormality of either intrarenal collecting
system. No wall thickening in either renal pelvis. Both ureters are
well opacified without focal hydroureter, filling defect, or wall
thickening. No focal bladder wall abnormality with a small segment
of the posterior bladder wall incompletely evaluated due to adjacent
non-opacified urine.

Stomach/Bowel: Stomach is nondistended. No gastric wall thickening.
No evidence of outlet obstruction. Duodenum is normally positioned
as is the ligament of Treitz. No small bowel wall thickening. No
small bowel dilatation. Terminal ileum is normal. The appendix is
normal. Diverticular changes are noted in the left colon without
evidence of diverticulitis.

Vascular/Lymphatic: There is abdominal aortic atherosclerosis
without aneurysm. Portal vein is patent as is the superior
mesenteric vein. There is a large venous collateral posterior to the
stomach which courses from the portal vein to the left renal vein.

No abdominal lymphadenopathy. No evidence for pelvic sidewall
lymphadenopathy.

Reproductive: Prostate gland and seminal vesicles are unremarkable.

Other: No intraperitoneal free fluid.

Musculoskeletal: Bone windows reveal no worrisome lytic or sclerotic
osseous lesions.
IMPRESSION: 1. 6 x 3 x 4 mm stone identified at the left UPJ without
obstruction.
2. 2 additional nonobstructing stones are seen in the left kidney.
3. No definite morphologic features of cirrhosis although there is a
large collateral vein coursing from the portal vein to the left
renal vein. This does raise the question of portal venous
hypertension.
4. Probable 1-2 mm gallstone.

## 2017-04-01 IMAGING — CR DG ABDOMEN 1V
2 series · 2 of 2 positions shown · non-contrast
Comparison: 11/13/2014, 10/22/2014

CLINICAL DATA: Morning of lithotripsy procedure. Left side kidney
stone.

EXAM:
ABDOMEN - 1 VIEW

[abdomen kub (1 of 2)]
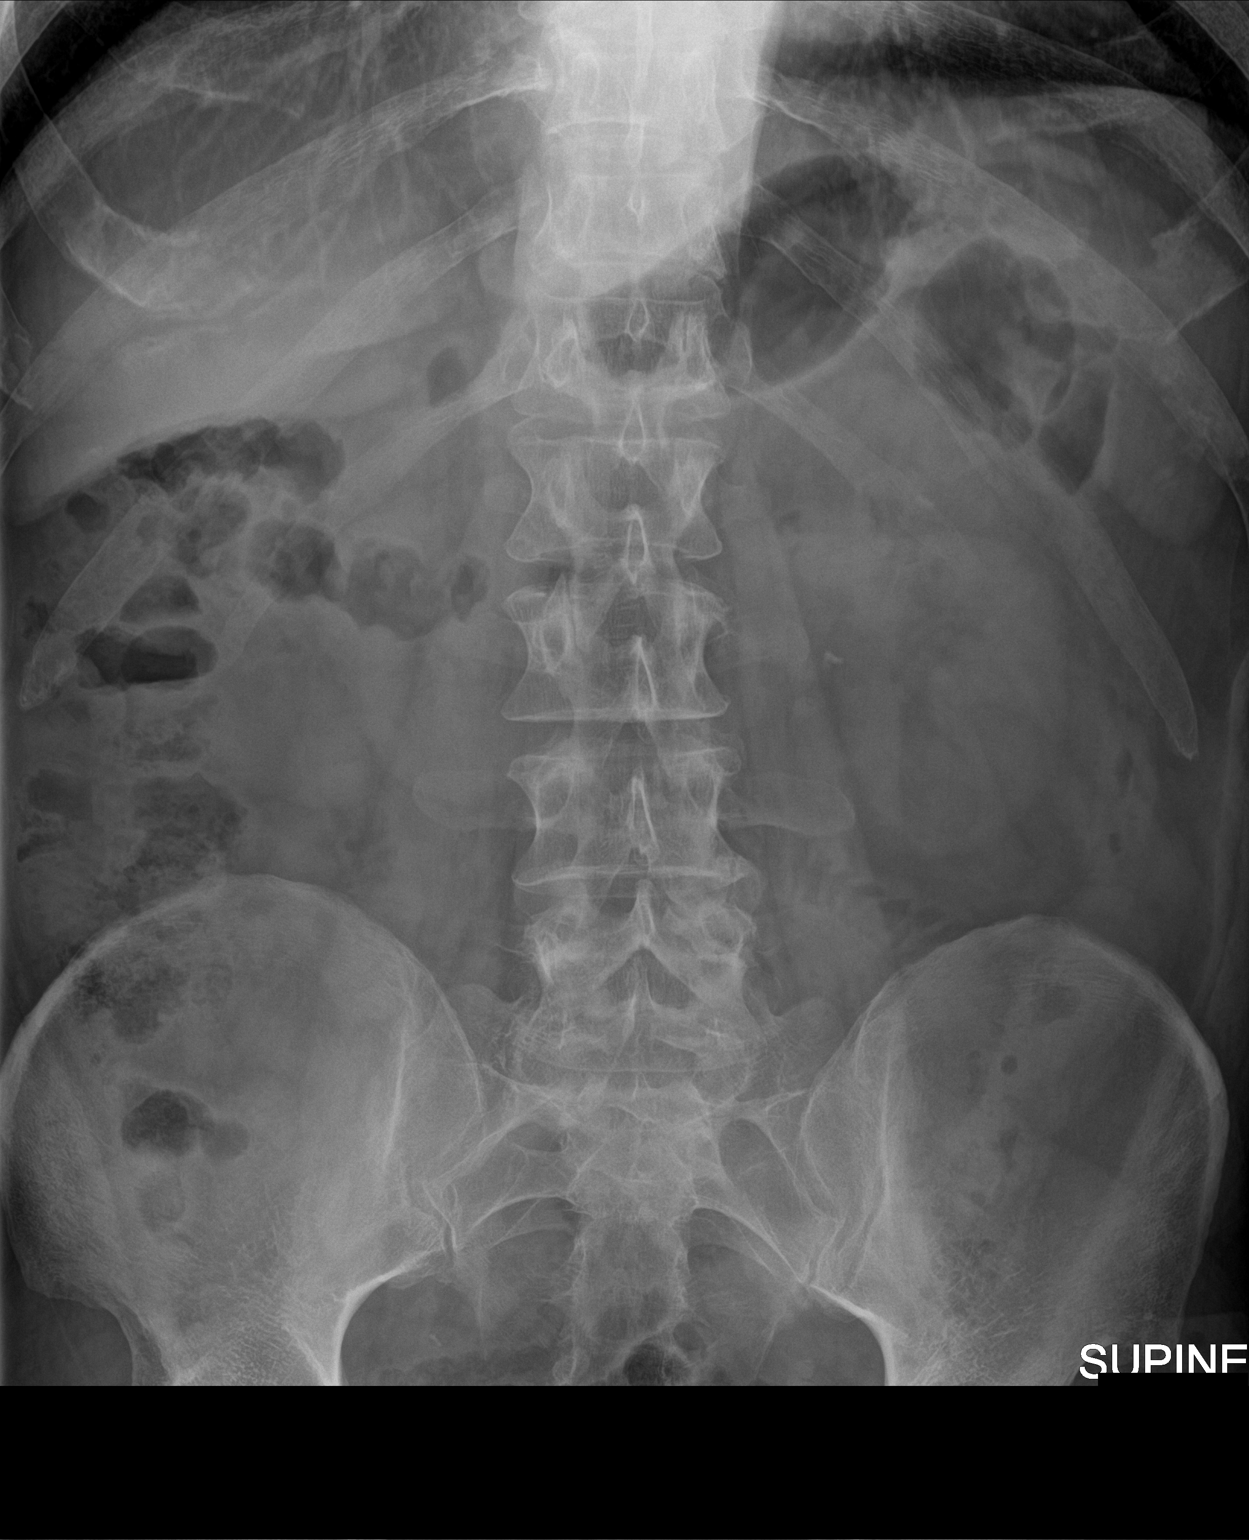

[abdomen kub (2 of 2)]
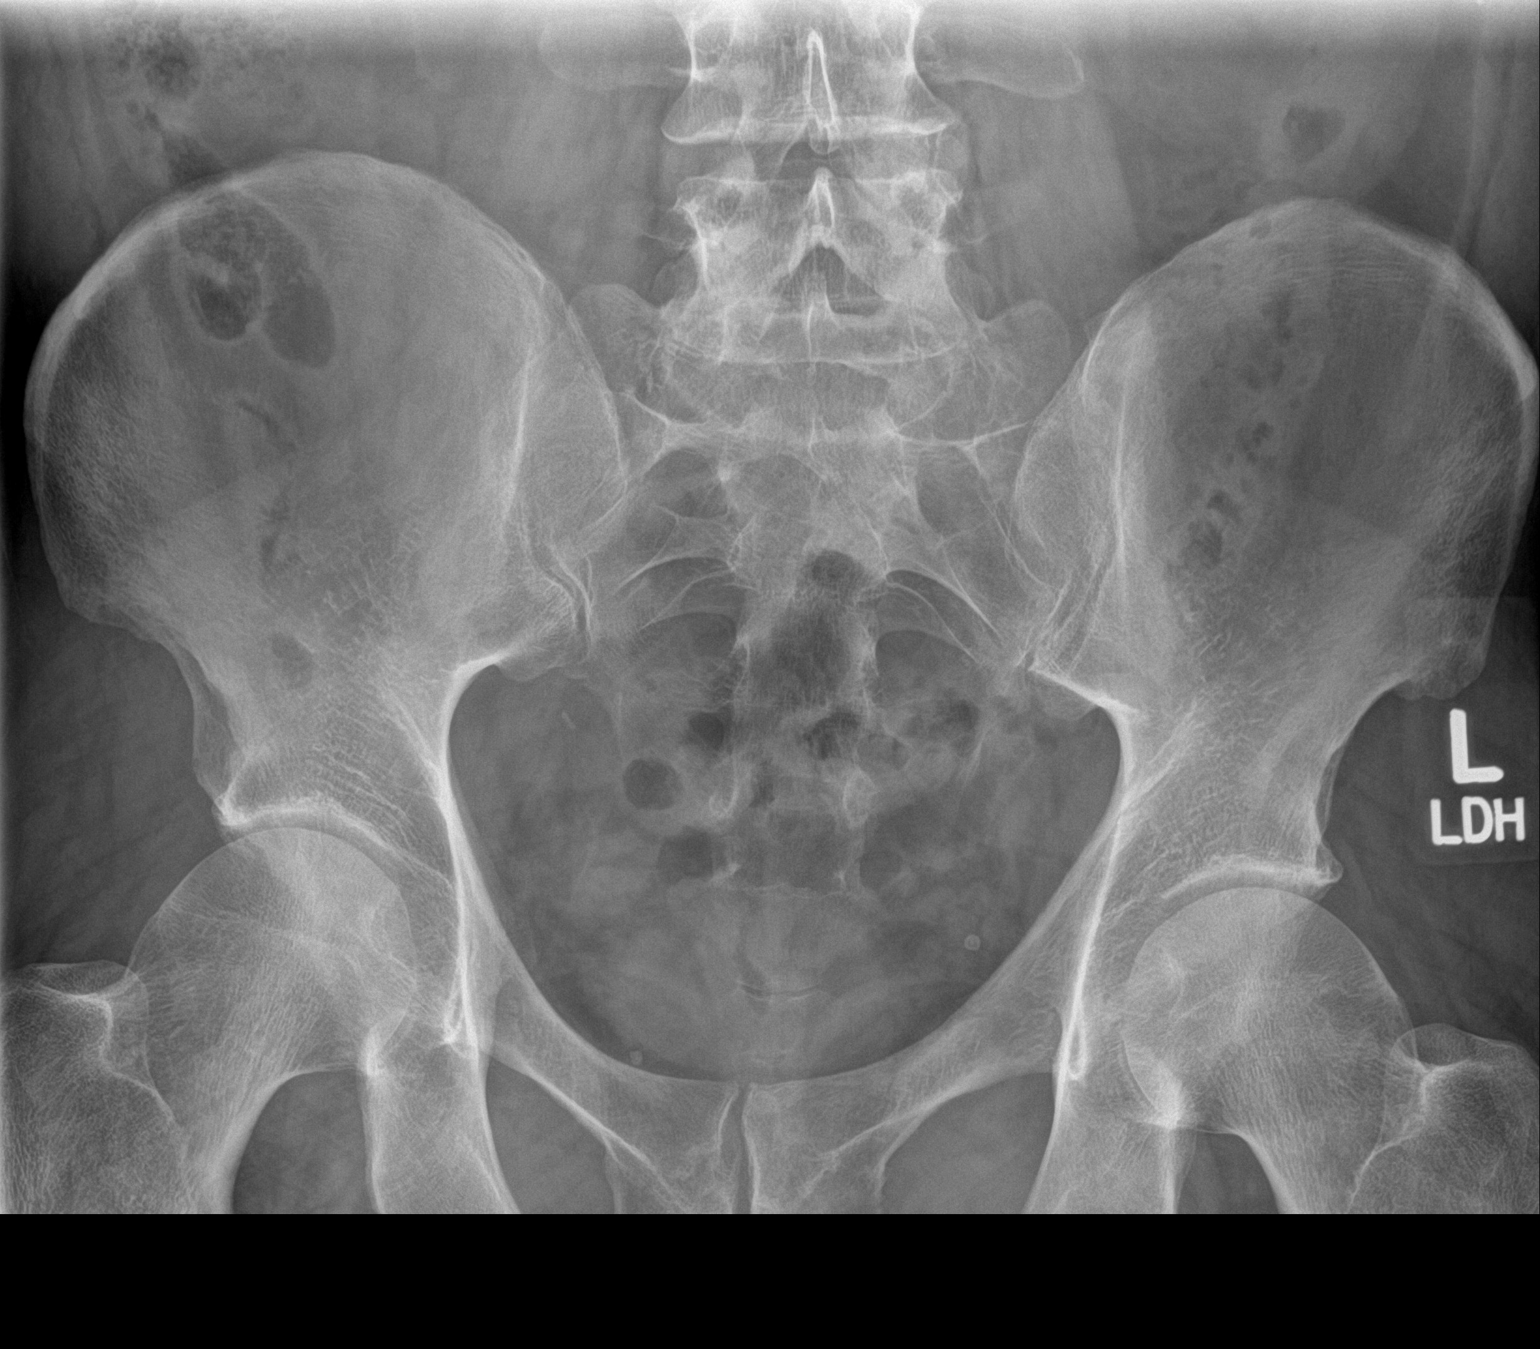

[2 of 2 positions shown; findings below may reference images not displayed]

FINDINGS: A 5 mm calcification is identified in the region of the left renal
pelvis, unchanged in appearance since previous study. Calcification
overlies the upper pole left kidney, likely representing intrarenal
calculus as seen on previous CT exam. Rounded calcifications in the
pelvis are likely vascular.
IMPRESSION: Calcification in the region of the left ureteropelvic junction.

Suspect intrarenal calculus in the region of the upper pole left
kidney.

## 2020-03-26 ENCOUNTER — Other Ambulatory Visit: Payer: Self-pay | Admitting: Internal Medicine

## 2020-03-26 DIAGNOSIS — N5089 Other specified disorders of the male genital organs: Secondary | ICD-10-CM

## 2020-04-03 ENCOUNTER — Other Ambulatory Visit: Payer: Self-pay

## 2020-04-03 ENCOUNTER — Ambulatory Visit
Admission: RE | Admit: 2020-04-03 | Discharge: 2020-04-03 | Disposition: A | Discharge status: Home / Self Care | Payer: Managed Care, Other (non HMO) | Source: Ambulatory Visit | Attending: Internal Medicine | Admitting: Internal Medicine

## 2020-04-03 DIAGNOSIS — N5089 Other specified disorders of the male genital organs: Secondary | ICD-10-CM | POA: Diagnosis not present

## 2020-08-24 ENCOUNTER — Other Ambulatory Visit: Payer: Self-pay

## 2020-08-24 DIAGNOSIS — R319 Hematuria, unspecified: Secondary | ICD-10-CM

## 2020-08-25 ENCOUNTER — Other Ambulatory Visit
Admission: RE | Admit: 2020-08-25 | Discharge: 2020-08-25 | Disposition: A | Discharge status: Home / Self Care | Payer: Managed Care, Other (non HMO) | Attending: Urology | Admitting: Urology

## 2020-08-25 ENCOUNTER — Ambulatory Visit (INDEPENDENT_AMBULATORY_CARE_PROVIDER_SITE_OTHER): Payer: Managed Care, Other (non HMO) | Admitting: Urology

## 2020-08-25 ENCOUNTER — Encounter: Payer: Self-pay | Admitting: Urology

## 2020-08-25 ENCOUNTER — Other Ambulatory Visit: Payer: Self-pay

## 2020-08-25 VITALS — BP 126/82 | HR 92 | Ht 70.0 in | Wt 201.0 lb

## 2020-08-25 DIAGNOSIS — R319 Hematuria, unspecified: Secondary | ICD-10-CM | POA: Insufficient documentation

## 2020-08-25 DIAGNOSIS — R310 Gross hematuria: Secondary | ICD-10-CM | POA: Diagnosis not present

## 2020-08-25 DIAGNOSIS — N4340 Spermatocele of epididymis, unspecified: Secondary | ICD-10-CM

## 2020-08-25 DIAGNOSIS — N200 Calculus of kidney: Secondary | ICD-10-CM

## 2020-08-25 LAB — URINALYSIS, COMPLETE (UACMP) WITH MICROSCOPIC
Bilirubin Urine: NEGATIVE
Glucose, UA: NEGATIVE mg/dL
Ketones, ur: NEGATIVE mg/dL
Leukocytes,Ua: NEGATIVE
Nitrite: NEGATIVE
Specific Gravity, Urine: 1.025 (ref 1.005–1.030)
pH: 6 (ref 5.0–8.0)

## 2020-08-25 NOTE — Progress Notes (Signed)
08/25/20 1:56 PM   Steven Vega Jun 02, 1960 [id number]  CC: Gross hematuria, right spermatocele  HPI: I saw Steven Vega in urology clinic today for gross hematuria.  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  He denies any lower abdominal pain, flank pain, dysuria, or other UTI symptoms.  His PCP checked a urinalysis on 08/11/2020 and this showed 10-50 RBCs, but was otherwise completely benign, and renal function was normal with creatinine 1.1.  He also had a PSA in December 2021 that was normal at 0.66.  He had a similar presentation in 2016 with asymptomatic gross hematuria and was found to have a left proximal ureteral stone and ultimately underwent shockwave lithotripsy with Dr. Apolinar Vega.  He denies any problems since that time.  In December 2021 he noticed a right-sided scrotal cyst, and scrotal ultrasound showed a 3 cm right spermatocele with no suspicious features.  He has a 40-pack-year smoking history and quit about 10 years ago.  He continues to use dip.  He reports rare social alcohol use.   PMH: Past Medical History:  Diagnosis Date  . Glaucoma   . Kidney stones     Surgical History: Past Surgical History:  Procedure Laterality Date  . EXTRACORPOREAL SHOCK WAVE LITHOTRIPSY  11/2014  . EXTRACORPOREAL SHOCK WAVE LITHOTRIPSY Left 11/20/2014   Procedure: EXTRACORPOREAL SHOCK WAVE LITHOTRIPSY (ESWL);  Surgeon: Steven Scotland, MD;  Location: [facility] ORS;  Service: Urology;  Laterality: Left;  . HAND TENDON SURGERY Left    thumb  . NO PAST SURGERIES    . TENDON REPAIR Left    60yrs old  . TONSILLECTOMY    . TONSILLECTOMY Bilateral    3rd grade    Family History: Family History  Problem Relation Age of Onset  . Breast cancer Mother   . Lung cancer Mother   . Prostate cancer Father   . Kidney disease Neg Hx     Social History:  reports that he quit smoking about 10 years ago. His smoking use  included cigarettes. He started smoking about 30 years ago. He has a 20.00 pack-year smoking history. His smokeless tobacco use includes chew. He reports that he does not drink alcohol and does not use drugs.  Physical Exam: BP 126/82   Pulse 92   Ht 5\' 10"  (1.778 m)   Wt 201 lb (91.2 kg)   BMI 28.84 kg/m    Constitutional:  Alert and oriented, No acute distress. Cardiovascular: No clubbing, cyanosis, or edema. Respiratory: Normal respiratory effort, no increased work of breathing. GI: Abdomen is soft, nontender, nondistended, no abdominal masses  Laboratory Data: Reviewed, see HPI  Pertinent Imaging: I have personally viewed and interpreted the CT urogram from July 2016 showing a 5 mm left UPJ stone with mild hydronephrosis  Assessment & Plan:   60 year old male with 1 to 2 weeks of asymptomatic gross hematuria, and microscopic hematuria concerned on urinalysis with 10-50 RBCs and no evidence of infection.  Notably, he has a history of a similar presentation in 2016 when a left UPJ stone was found and he underwent shockwave lithotripsy at that time.  We discussed common possible etiologies of hematuria including BPH, malignancy, urolithiasis, medical renal disease, and idiopathic. Standard workup recommended by the AUA includes imaging with CT urogram to assess the upper tracts, and cystoscopy. Cytology is performed on patient's with gross hematuria to look for malignant cells in the urine.  CT  urogram and cystoscopy to complete gross hematuria work-up  Steven Rams, MD 08/25/2020  [facility] [address], [address] [place], [place] [address] [phone]

## 2020-08-25 NOTE — Patient Instructions (Signed)
Cystoscopy Cystoscopy is a procedure that is used to help diagnose and sometimes treat conditions that affect the lower urinary tract. The lower urinary tract includes the bladder and the urethra. The urethra is the tube that drains urine from the bladder. Cystoscopy is done using a thin, tube-shaped instrument with a light and camera at the end (cystoscope). The cystoscope may be hard or flexible, depending on the goal of the procedure. The cystoscope is inserted through the urethra, into the bladder. Cystoscopy may be recommended if you have:  Urinary tract infections that keep coming back.  Blood in the urine (hematuria).  An inability to control when you urinate (urinary incontinence) or an overactive bladder.  Unusual cells found in a urine sample.  A blockage in the urethra, such as a urinary stone.  Painful urination.  An abnormality in the bladder found during an intravenous pyelogram (IVP) or CT scan. Cystoscopy may also be done to remove a sample of tissue to be examined under a microscope (biopsy). Tell a health care provider about:  Any allergies you have.  All medicines you are taking, including vitamins, herbs, eye drops, creams, and over-the-counter medicines.  Any problems you or family members have had with anesthetic medicines.  Any blood disorders you have.  Any surgeries you have had.  Any medical conditions you have.  Whether you are pregnant or may be pregnant. What are the risks? Generally, this is a safe procedure. However, problems may occur, including:  Infection.  Bleeding.  Allergic reactions to medicines.  Damage to other structures or organs. What happens before the procedure? Medicines Ask your health care provider about:  Changing or stopping your regular medicines. This is especially important if you are taking diabetes medicines or blood thinners.  Taking medicines such as aspirin and ibuprofen. These medicines can thin your blood. Do  not take these medicines unless your health care provider tells you to take them.  Taking over-the-counter medicines, vitamins, herbs, and supplements. Tests You may have an exam or testing, such as:  X-rays of the bladder, urethra, or kidneys.  CT scan of the abdomen or pelvis.  Urine tests to check for signs of infection. General instructions  Follow instructions from your health care provider about eating or drinking restrictions.  Ask your health care provider what steps will be taken to help prevent infection. These steps may include: ? Washing skin with a germ-killing soap. ? Taking antibiotic medicine.  Plan to have a responsible adult take you home from the hospital or clinic. What happens during the procedure?  You will be given one or more of the following: ? A medicine to help you relax (sedative). ? A medicine to numb the area (local anesthetic).  The area around the opening of your urethra will be cleaned.  The cystoscope will be passed through your urethra into your bladder.  Germ-free (sterile) fluid will flow through the cystoscope to fill your bladder. The fluid will stretch your bladder so that your health care provider can clearly examine your bladder walls.  Your doctor will look at the urethra and bladder. Your doctor may take a biopsy or remove stones.  The cystoscope will be removed, and your bladder will be emptied. The procedure may vary among health care providers and hospitals.   What can I expect after the procedure? After the procedure, it is common to have:  Some soreness or pain in your abdomen and urethra.  Urinary symptoms. These include: ? Mild pain or burning   when you urinate. Pain should stop within a few minutes after you urinate. This may last for up to 1 week. ? A small amount of blood in your urine for several days. ? Feeling like you need to urinate but producing only a small amount of urine. Follow these instructions at  home: Medicines  Take over-the-counter and prescription medicines only as told by your health care provider.  If you were prescribed an antibiotic medicine, take it as told by your health care provider. Do not stop taking the antibiotic even if you start to feel better. General instructions  Return to your normal activities as told by your health care provider. Ask your health care provider what activities are safe for you.  If you were given a sedative during the procedure, it can affect you for several hours. Do not drive or operate machinery until your health care provider says that it is safe.  Watch for any blood in your urine. If the amount of blood in your urine increases, call your health care provider.  Follow instructions from your health care provider about eating or drinking restrictions.  If a tissue sample was removed for testing (biopsy) during your procedure, it is up to you to get your test results. Ask your health care provider, or the department that is doing the test, when your results will be ready.  Drink enough fluid to keep your urine pale yellow.  Keep all follow-up visits. This is important. Contact a health care provider if:  You have pain that gets worse or does not get better with medicine, especially pain when you urinate.  You have trouble urinating.  You have more blood in your urine. Get help right away if:  You have blood clots in your urine.  You have abdominal pain.  You have a fever or chills.  You are unable to urinate. Summary  Cystoscopy is a procedure that is used to help diagnose and sometimes treat conditions that affect the lower urinary tract.  Cystoscopy is done using a thin, tube-shaped instrument with a light and camera at the end.  After the procedure, it is common to have some soreness or pain in your abdomen and urethra.  Watch for any blood in your urine. If the amount of blood in your urine increases, call your health  care provider.  If you were prescribed an antibiotic medicine, take it as told by your health care provider. Do not stop taking the antibiotic even if you start to feel better. This information is not intended to replace advice given to you by your health care provider. Make sure you discuss any questions you have with your health care provider. Document Revised: 11/15/2019 Document Reviewed: 11/15/2019 Elsevier Patient Education  2021 [employer]vier Inc[employer]  

## 2020-09-15 ENCOUNTER — Other Ambulatory Visit: Payer: Self-pay

## 2020-09-15 ENCOUNTER — Ambulatory Visit
Admission: RE | Admit: 2020-09-15 | Discharge: 2020-09-15 | Disposition: A | Discharge status: Home / Self Care | Payer: Managed Care, Other (non HMO) | Source: Ambulatory Visit | Attending: Urology | Admitting: Urology

## 2020-09-15 DIAGNOSIS — R310 Gross hematuria: Secondary | ICD-10-CM | POA: Insufficient documentation

## 2020-09-15 LAB — POCT I-STAT CREATININE: Creatinine, Ser: 1.1 mg/dL (ref 0.61–1.24)

## 2020-09-15 MED ORDER — IOHEXOL 300 MG/ML  SOLN
150.0000 mL | Freq: Once | INTRAMUSCULAR | Status: AC | PRN
  Administered 2020-09-15: 125 mL via INTRAVENOUS

## 2020-09-17 ENCOUNTER — Other Ambulatory Visit: Payer: Self-pay

## 2020-09-17 ENCOUNTER — Ambulatory Visit (INDEPENDENT_AMBULATORY_CARE_PROVIDER_SITE_OTHER): Payer: Managed Care, Other (non HMO) | Admitting: Urology

## 2020-09-17 ENCOUNTER — Encounter: Payer: Self-pay | Admitting: Urology

## 2020-09-17 VITALS — BP 135/82 | HR 93 | Ht 70.0 in | Wt 200.0 lb

## 2020-09-17 DIAGNOSIS — N200 Calculus of kidney: Secondary | ICD-10-CM

## 2020-09-17 DIAGNOSIS — R310 Gross hematuria: Secondary | ICD-10-CM | POA: Diagnosis not present

## 2020-09-17 MED ORDER — LIDOCAINE HCL URETHRAL/MUCOSAL 2 % EX GEL
1.0000 "application " | Freq: Once | CUTANEOUS | Status: AC
  Administered 2020-09-17: 1 via URETHRAL

## 2020-09-17 NOTE — Progress Notes (Signed)
Cystoscopy Procedure Note:  Indication: Gross hematuria  After informed consent and discussion of the procedure and its risks, Steven Vega was positioned and prepped in the standard fashion. Cystoscopy was performed with a flexible cystoscope. The urethra, bladder neck and entire bladder was visualized in a standard fashion. The prostate was moderate in size. The ureteral orifices were visualized in their normal location and orientation.  Bladder mucosa normal throughout, no abnormalities on retroflexion.  Cytology sent.  Imaging: I personally viewed and interpreted the CT showing a 4 mm left renal stone with no evidence of hydronephrosis or obstruction, 950HU, 11cm SSD  Findings: Normal cystoscopy  Assessment and Plan: We discussed options of shockwave lithotripsy, ureteroscopy, or observation for his nonobstructing 4 mm left renal stone, he opts for observation.  Consider shockwave lithotripsy in the future if becomes symptomatic  RTC 1 year KUB  Legrand Rams, MD 09/17/2020

## 2020-09-17 NOTE — Patient Instructions (Signed)
Dietary Guidelines to Help Prevent Kidney Stones Kidney stones are deposits of minerals and salts that form inside your kidneys. Your risk of developing kidney stones may be greater depending on your diet, your lifestyle, the medicines you take, and whether you have certain medical conditions. Most people can lower their chances of developing kidney stones by following the instructions below. Your dietitian may give you more specific instructions depending on your overall health and the type of kidney stones you tend to develop. What are tips for following this plan? Reading food labels  Choose foods with "no salt added" or "low-salt" labels. Limit your salt (sodium) intake to less than 1,500 mg a day.  Choose foods with calcium for each meal and snack. Try to eat about 300 mg of calcium at each meal. Foods that contain 200-500 mg of calcium a serving include: ? 8 oz (237 mL) of milk, calcium-fortifiednon-dairy milk, and calcium-fortifiedfruit juice. Calcium-fortified means that calcium has been added to these drinks. ? 8 oz (237 mL) of kefir, yogurt, and soy yogurt. ? 4 oz (114 g) of tofu. ? 1 oz (28 g) of cheese. ? 1 cup (150 g) of dried figs. ? 1 cup (91 g) of cooked broccoli. ? One 3 oz (85 g) can of sardines or mackerel. Most people need 1,000-1,500 mg of calcium a day. Talk to your dietitian about how much calcium is recommended for you.   Shopping  Buy plenty of fresh fruits and vegetables. Most people do not need to avoid fruits and vegetables, even if these foods contain nutrients that may contribute to kidney stones.  When shopping for convenience foods, choose: ? Whole pieces of fruit. ? Pre-made salads with dressing on the side. ? Low-fat fruit and yogurt smoothies.  Avoid buying frozen meals or prepared deli foods. These can be high in sodium.  Look for foods with live cultures, such as yogurt and kefir.  Choose high-fiber grains, such as whole-wheat breads, oat bran, and  wheat cereals. Cooking  Do not add salt to food when cooking. Place a salt shaker on the table and allow each person to add his or her own salt to taste.  Use vegetable protein, such as beans, textured vegetable protein (TVP), or tofu, instead of meat in pasta, casseroles, and soups. Meal planning  Eat less salt, if told by your dietitian. To do this: ? Avoid eating processed or pre-made food. ? Avoid eating fast food.  Eat less animal protein, including cheese, meat, poultry, or fish, if told by your dietitian. To do this: ? Limit the number of times you have meat, poultry, fish, or cheese each week. Eat a diet free of meat at least 2 days a week. ? Eat only one serving each day of meat, poultry, fish, or seafood. ? When you prepare animal protein, cut pieces into small portion sizes. For most meat and fish, one serving is about the size of the palm of your hand.  Eat at least five servings of fresh fruits and vegetables each day. To do this: ? Keep fruits and vegetables on hand for snacks. ? Eat one piece of fruit or a handful of berries with breakfast. ? Have a salad and fruit at lunch. ? Have two kinds of vegetables at dinner.  Limit foods that are high in a substance called oxalate. These include: ? Spinach (cooked), rhubarb, beets, sweet potatoes, and Swiss chard. ? Peanuts. ? Potato chips, french fries, and baked potatoes with skin on. ? Nuts and   nut products. ? Chocolate.  If you regularly take a diuretic medicine, make sure to eat at least 1 or 2 servings of fruits or vegetables that are high in potassium each day. These include: ? Avocado. ? Banana. ? Orange, prune, carrot, or tomato juice. ? Baked potato. ? Cabbage. ? Beans and split peas. Lifestyle  Drink enough fluid to keep your urine pale yellow. This is the most important thing you can do. Spread your fluid intake throughout the day.  If you drink alcohol: ? Limit how much you use to:  0-1 drink a day for  women who are not pregnant.  0-2 drinks a day for men. ? Be aware of how much alcohol is in your drink. In the [place] one drink equals one 12 oz bottle of beer (355 mL), one 5 oz glass of wine (148 mL), or one 1 oz glass of hard liquor (44 mL).  Lose weight if told by your health care provider. Work with your dietitian to find an eating plan and weight loss strategies that work best for you.   General information  Talk to your health care provider and dietitian about taking daily supplements. You may be told the following depending on your health and the cause of your kidney stones: ? Not to take supplements with vitamin C. ? To take a calcium supplement. ? To take a daily probiotic supplement. ? To take other supplements such as magnesium, fish oil, or vitamin B6.  Take over-the-counter and prescription medicines only as told by your health care provider. These include supplements. What foods should I limit? Limit your intake of the following foods, or eat them as told by your dietitian. Vegetables Spinach. Rhubarb. Beets. Canned vegetables. Pickles. Olives. Baked potatoes with skin. Grains Wheat bran. Baked goods. Salted crackers. Cereals high in sugar. Meats and other proteins Nuts. Nut butters. Large portions of meat, poultry, or fish. Salted, precooked, or cured meats, such as sausages, meat loaves, and hot dogs. Dairy Cheese. Beverages Regular soft drinks. Regular vegetable juice. Seasonings and condiments Seasoning blends with salt. Salad dressings. Soy sauce. Ketchup. Barbecue sauce. Other foods Canned soups. Canned pasta sauce. Casseroles. Pizza. Lasagna. Frozen meals. Potato chips. French fries. The items listed above may not be a complete list of foods and beverages you should limit. Contact a dietitian for more information. What foods should I avoid? Talk to your dietitian about specific foods you should avoid based on the type of kidney stones you have and your overall  health. Fruits Grapefruit. The item listed above may not be a complete list of foods and beverages you should avoid. Contact a dietitian for more information. Summary  Kidney stones are deposits of minerals and salts that form inside your kidneys.  You can lower your risk of kidney stones by making changes to your diet.  The most important thing you can do is drink enough fluid. Drink enough fluid to keep your urine pale yellow.  Talk to your dietitian about how much calcium you should have each day, and eat less salt and animal protein as told by your dietitian. This information is not intended to replace advice given to you by your health care provider. Make sure you discuss any questions you have with your health care provider. Document Revised: 03/28/2019 Document Reviewed: 03/28/2019 [employer] Patient Education  2021 [employer]  

## 2020-09-18 LAB — URINALYSIS, COMPLETE
Bilirubin, UA: NEGATIVE
Glucose, UA: NEGATIVE
Ketones, UA: NEGATIVE
Leukocytes,UA: NEGATIVE
Nitrite, UA: NEGATIVE
Specific Gravity, UA: 1.02 (ref 1.005–1.030)
Urobilinogen, Ur: 1 mg/dL (ref 0.2–1.0)
pH, UA: 6 (ref 5.0–7.5)

## 2020-09-18 LAB — MICROSCOPIC EXAMINATION
Bacteria, UA: NONE SEEN
Epithelial Cells (non renal): NONE SEEN /hpf (ref 0–10)

## 2020-09-21 LAB — CYTOLOGY - NON PAP

## 2020-09-22 ENCOUNTER — Telehealth: Payer: Self-pay

## 2020-09-22 NOTE — Telephone Encounter (Signed)
-----   Message from Sondra Come, MD sent at 09/22/2020  9:01 AM EDT ----- No cancer cells on urine sample, keep follow-up as scheduled

## 2020-09-22 NOTE — Telephone Encounter (Signed)
Called pt no answer, unable to leave message as voicemail is not set up. 1st attempt.

## 2020-09-23 NOTE — Telephone Encounter (Signed)
Called pt informed him of the information below. Pt gave verbal understanding.  

## 2021-09-23 ENCOUNTER — Ambulatory Visit: Payer: Self-pay | Admitting: Urology

## 2021-09-24 ENCOUNTER — Encounter: Payer: Self-pay | Admitting: Urology
